# Patient Record
Sex: Female | Born: 1975 | Race: White | Hispanic: No | Marital: Married | Smoking: Former smoker
Health system: Southern US, Community
[De-identification: ages and names within clinical notes are randomized; demographics above are authoritative.]

## PROBLEM LIST (undated history)

## (undated) DIAGNOSIS — Z87891 Personal history of nicotine dependence: Secondary | ICD-10-CM

## (undated) DIAGNOSIS — F419 Anxiety disorder, unspecified: Secondary | ICD-10-CM

## (undated) DIAGNOSIS — L4050 Arthropathic psoriasis, unspecified: Secondary | ICD-10-CM

## (undated) DIAGNOSIS — T8859XA Other complications of anesthesia, initial encounter: Secondary | ICD-10-CM

## (undated) DIAGNOSIS — G43909 Migraine, unspecified, not intractable, without status migrainosus: Secondary | ICD-10-CM

## (undated) DIAGNOSIS — D259 Leiomyoma of uterus, unspecified: Secondary | ICD-10-CM

## (undated) DIAGNOSIS — E079 Disorder of thyroid, unspecified: Secondary | ICD-10-CM

## (undated) DIAGNOSIS — G4730 Sleep apnea, unspecified: Secondary | ICD-10-CM

## (undated) DIAGNOSIS — Z8719 Personal history of other diseases of the digestive system: Secondary | ICD-10-CM

## (undated) DIAGNOSIS — M5030 Other cervical disc degeneration, unspecified cervical region: Secondary | ICD-10-CM

## (undated) DIAGNOSIS — K8050 Calculus of bile duct without cholangitis or cholecystitis without obstruction: Secondary | ICD-10-CM

## (undated) DIAGNOSIS — E890 Postprocedural hypothyroidism: Secondary | ICD-10-CM

## (undated) DIAGNOSIS — E669 Obesity, unspecified: Secondary | ICD-10-CM

## (undated) DIAGNOSIS — N83209 Unspecified ovarian cyst, unspecified side: Secondary | ICD-10-CM

## (undated) DIAGNOSIS — J45909 Unspecified asthma, uncomplicated: Secondary | ICD-10-CM

## (undated) DIAGNOSIS — N921 Excessive and frequent menstruation with irregular cycle: Secondary | ICD-10-CM

## (undated) DIAGNOSIS — K635 Polyp of colon: Secondary | ICD-10-CM

## (undated) DIAGNOSIS — I9589 Other hypotension: Secondary | ICD-10-CM

## (undated) DIAGNOSIS — T7840XA Allergy, unspecified, initial encounter: Secondary | ICD-10-CM

## (undated) DIAGNOSIS — R112 Nausea with vomiting, unspecified: Secondary | ICD-10-CM

## (undated) HISTORY — DX: Unspecified ovarian cyst, unspecified side: N83.209

## (undated) HISTORY — DX: Anxiety disorder, unspecified: F41.9

## (undated) HISTORY — DX: Allergy, unspecified, initial encounter: T78.40XA

## (undated) HISTORY — PX: THYROIDECTOMY: SHX17

---
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
## 1995-05-11 HISTORY — PX: LAPAROSCOPY: SHX197

## 2010-05-10 HISTORY — PX: THYROIDECTOMY: SHX17

## 2016-05-10 HISTORY — PX: NASAL SINUS SURGERY: SHX719

## 2016-05-10 HISTORY — PX: RHINOPLASTY: SUR1284

## 2016-07-19 ENCOUNTER — Ambulatory Visit
Admission: EM | Admit: 2016-07-19 | Discharge: 2016-07-19 | Disposition: A | Discharge status: Home / Self Care | Payer: BLUE CROSS/BLUE SHIELD | Attending: Family Medicine | Admitting: Family Medicine

## 2016-07-19 ENCOUNTER — Encounter: Payer: Self-pay | Admitting: *Deleted

## 2016-07-19 DIAGNOSIS — J209 Acute bronchitis, unspecified: Secondary | ICD-10-CM | POA: Diagnosis not present

## 2016-07-19 DIAGNOSIS — J069 Acute upper respiratory infection, unspecified: Secondary | ICD-10-CM | POA: Diagnosis not present

## 2016-07-19 DIAGNOSIS — J0101 Acute recurrent maxillary sinusitis: Secondary | ICD-10-CM | POA: Diagnosis not present

## 2016-07-19 HISTORY — DX: Disorder of thyroid, unspecified: E07.9

## 2016-07-19 MED ORDER — HYDROCOD POLST-CPM POLST ER 10-8 MG/5ML PO SUER: 5.0000 mL | Freq: Two times a day (BID) | ORAL | 0 refills | Status: DC | PRN

## 2016-07-19 MED ORDER — IPRATROPIUM-ALBUTEROL 0.5-2.5 (3) MG/3ML IN SOLN
3.0000 mL | Freq: Once | RESPIRATORY_TRACT | Status: AC
  Administered 2016-07-19: 3 mL via RESPIRATORY_TRACT

## 2016-07-19 MED ORDER — PREDNISONE 10 MG (21) PO TBPK: ORAL_TABLET | ORAL | 0 refills | Status: DC

## 2016-07-19 MED ORDER — ALBUTEROL SULFATE HFA 108 (90 BASE) MCG/ACT IN AERS: 2.0000 | INHALATION_SPRAY | RESPIRATORY_TRACT | 1 refills | Status: DC | PRN

## 2016-07-19 MED ORDER — METHYLPREDNISOLONE SODIUM SUCC 125 MG IJ SOLR
125.0000 mg | Freq: Once | INTRAMUSCULAR | Status: AC
  Administered 2016-07-19: 125 mg via INTRAMUSCULAR

## 2016-07-19 MED ORDER — AZITHROMYCIN 500 MG PO TABS: ORAL_TABLET | ORAL | 0 refills | Status: DC

## 2016-07-19 NOTE — ED Provider Notes (Signed)
[facility]    CSN: [id number] Arrival date & time: 07/19/16  1624     History   Chief Complaint Chief Complaint  Patient presents with  . Cough  . Nasal Congestion  . Headache    HPI Alison Logan is a 41 y.o. female.   Patient is a 41 year old white female who's had a history of recurrent sinusitis and nasal congestion. She has isthroat doctor she sees in [place] and she states in the next few weeks she's also have her maxillary sinuses reamed out try to help clear up her sinuses and also take care of recurrent and persistent serous otitis media on the right. She reports she normally can't breathe through her nose and her ears always feel stuffy bolus 3-4 days she's had increased drainage down the back with throat which feels has gotten to the lungs. She states that now she is coughing was nonproductive she's wheezing she is unable to sleep at night and had to sit reclined up because the shortness of breath. No history of asthma but this been no fever either. The cough is not productive because she feels that she can capture. She's had a thyroidectomy but no known drug allergies habits she stopped smoking about 12 years ago. No pertinent family medical history relevant to today's visit   The history is provided by the patient. No language interpreter was used.  Cough  Cough characteristics:  Productive and non-productive Sputum characteristics:  Nondescript Severity:  Moderate Onset quality:  Sudden Timing:  Constant Progression:  Worsening Chronicity:  New Smoker: no   Context: upper respiratory infection and with activity   Relieved by:  Nothing Ineffective treatments:  None tried Associated symptoms: headaches, shortness of breath and sinus congestion   Associated symptoms: no chest pain, no fever, no rash, no rhinorrhea and no sore throat   Headache  Associated symptoms: cough   Associated symptoms: no fever and no sore throat     Past Medical  History:  Diagnosis Date  . Thyroid disease     There are no active problems to display for this patient.   Past Surgical History:  Procedure Laterality Date  . THYROIDECTOMY Bilateral     OB History    No data available       Home Medications    Prior to Admission medications   Medication Sig Start Date End Date Taking? Authorizing Provider  levothyroxine (SYNTHROID, LEVOTHROID) 112 MCG tablet Take 112 mcg by mouth daily before breakfast.   Yes Historical Provider, MD  albuterol (PROVENTIL HFA;VENTOLIN HFA) 108 (90 Base) MCG/ACT inhaler Inhale 2 puffs into the lungs every 4 (four) hours as needed for wheezing or shortness of breath. 07/19/16   Frederich Cha, MD  azithromycin (ZITHROMAX) 500 MG tablet 1 tablet daily for 5 days 07/19/16   Frederich Cha, MD  chlorpheniramine-HYDROcodone [facility]) 10-8 MG/5ML SUER Take 5 mLs by mouth every 12 (twelve) hours as needed for cough. 07/19/16   Frederich Cha, MD  predniSONE (STERAPRED UNI-PAK 21 TAB) 10 MG (21) TBPK tablet Sig 6 tablet day 1, 5 tablets day 2, 4 tablets day 3,,3tablets day 4, 2 tablets day 5, 1 tablet day 6 take all tablets orally 07/19/16   Frederich Cha, MD    Family History History reviewed. No pertinent family history.  Social History Social History  Substance Use Topics  . Smoking status: Never Smoker  . Smokeless tobacco: Never Used  . Alcohol use No     Allergies  Patient has no known allergies.   Review of Systems Review of Systems  Constitutional: Negative for fever.  HENT: Negative for rhinorrhea and sore throat.   Respiratory: Positive for cough and shortness of breath.   Cardiovascular: Negative for chest pain.  Skin: Negative for rash.  Neurological: Positive for headaches.  All other systems reviewed and are negative.    Physical Exam Triage Vital Signs ED Triage Vitals  Enc Vitals Group     BP 07/19/16 1706 133/69     Pulse Rate 07/19/16 1706 68     Resp 07/19/16 1706 16      Temp 07/19/16 1706 98.3 F (36.8 C)     Temp Source 07/19/16 1706 Oral     SpO2 07/19/16 1706 98 %     Weight 07/19/16 1708 205 lb (93 kg)     Height 07/19/16 1708 5\' 4"  (1.626 m)     Head Circumference --      Peak Flow --      Pain Score --      Pain Loc --      Pain Edu? --      Excl. in [place]? --    No data found.   Updated Vital Signs BP 133/69 (BP Location: Left Arm)   Pulse 68   Temp 98.3 F (36.8 C) (Oral)   Resp 16   Ht 5\' 4"  (1.626 m)   Wt 205 lb (93 kg)   LMP 06/25/2016 (Exact Date)   SpO2 98%   BMI 35.19 kg/m   Visual Acuity Right Eye Distance:   Left Eye Distance:   Bilateral Distance:    Right Eye Near:   Left Eye Near:    Bilateral Near:     Physical Exam  Constitutional: She appears well-developed and well-nourished.  HENT:  Head: Normocephalic and atraumatic.  Right Ear: Hearing, external ear and ear canal normal. Tympanic membrane is bulging.  Left Ear: Hearing, tympanic membrane and external ear normal.  Nose: Mucosal edema and rhinorrhea present. Right sinus exhibits maxillary sinus tenderness. Left sinus exhibits maxillary sinus tenderness.  Mouth/Throat: Uvula is midline and mucous membranes are normal. No oral lesions. No uvula swelling. Posterior oropharyngeal edema present.  Eyes: Pupils are equal, round, and reactive to light.  Neck: Normal range of motion. Neck supple.  Cardiovascular: Normal rate and regular rhythm.   Pulmonary/Chest: No respiratory distress. She has decreased breath sounds. She has wheezes. She exhibits no tenderness.  Abdominal: Soft.  Musculoskeletal: Normal range of motion.  Neurological: She is alert.  Skin: Skin is warm.  Psychiatric: She has a normal mood and affect.  Vitals reviewed.    UC Treatments / Results  Labs (all labs ordered are listed, but only abnormal results are displayed) Labs Reviewed - No data to display  EKG  EKG Interpretation None       Radiology No results  found.  Procedures Procedures (including critical care time)  Medications Ordered in UC Medications  ipratropium-albuterol (DUONEB) 0.5-2.5 (3) MG/3ML nebulizer solution 3 mL (3 mLs Nebulization Given 07/19/16 1828)  methylPREDNISolone sodium succinate (SOLU-MEDROL) 125 mg/2 mL injection 125 mg (125 mg Intramuscular Given 07/19/16 1825)     Initial Impression / Assessment and Plan / UC Course  I have reviewed the triage vital signs and the nursing notes.  Pertinent labs & imaging results that were available during my care of the patient were reviewed by me and considered in my medical decision making (see chart for details).  Patient is given aerosol treatment discussed with her we can treat her aggressively which she'll ask so will treat with aerosol treatment DuoNeb and 125 mg Solu-Medrol. We'll place on 60 course of prednisone Zithromax 501 tablet daily for 5 days and will also place her on 6 day course of prednisone and albuterol inhaler. Explained to her this may help her sinuses if it does great but will try to mainly treat the bronchitis and the bronchospasm that she's suffering from. Off work note but she declined.   Final Clinical Impressions(s) / UC Diagnoses   Final diagnoses:  Acute recurrent maxillary sinusitis  Acute bronchitis with bronchospasm  Acute upper respiratory infection    New Prescriptions New Prescriptions   ALBUTEROL (PROVENTIL HFA;VENTOLIN HFA) 108 (90 BASE) MCG/ACT INHALER    Inhale 2 puffs into the lungs every 4 (four) hours as needed for wheezing or shortness of breath.   AZITHROMYCIN (ZITHROMAX) 500 MG TABLET    1 tablet daily for 5 days   CHLORPHENIRAMINE-HYDROCODONE (TUSSIONEX PENNKINETIC ER) 10-8 MG/5ML SUER    Take 5 mLs by mouth every 12 (twelve) hours as needed for cough.   PREDNISONE (STERAPRED UNI-PAK 21 TAB) 10 MG (21) TBPK TABLET    Sig 6 tablet day 1, 5 tablets day 2, 4 tablets day 3,,3tablets day 4, 2 tablets day 5, 1 tablet day 6 take all  tablets orally      Note: This dictation was prepared with Dragon dictation along with smaller phrase technology. Any transcriptional errors that result from this process are unintentional.   Frederich Cha, MD 07/19/16 [phone]

## 2016-07-19 NOTE — ED Triage Notes (Signed)
Non-productive cough, head and chest congestion, headaches and runny nose. Pt scheduled for sinus surgery later this month. Hx of sinusitis.

## 2016-10-20 ENCOUNTER — Other Ambulatory Visit: Payer: Self-pay | Admitting: Family Medicine

## 2016-10-20 DIAGNOSIS — Z1239 Encounter for other screening for malignant neoplasm of breast: Secondary | ICD-10-CM

## 2016-12-21 DIAGNOSIS — J309 Allergic rhinitis, unspecified: Secondary | ICD-10-CM | POA: Insufficient documentation

## 2016-12-21 DIAGNOSIS — R519 Headache, unspecified: Secondary | ICD-10-CM | POA: Insufficient documentation

## 2016-12-21 DIAGNOSIS — J324 Chronic pansinusitis: Secondary | ICD-10-CM | POA: Insufficient documentation

## 2016-12-21 DIAGNOSIS — G4730 Sleep apnea, unspecified: Secondary | ICD-10-CM | POA: Insufficient documentation

## 2017-01-05 DIAGNOSIS — J4520 Mild intermittent asthma, uncomplicated: Secondary | ICD-10-CM | POA: Insufficient documentation

## 2017-01-05 DIAGNOSIS — Z91018 Allergy to other foods: Secondary | ICD-10-CM | POA: Insufficient documentation

## 2017-01-05 DIAGNOSIS — G43909 Migraine, unspecified, not intractable, without status migrainosus: Secondary | ICD-10-CM | POA: Insufficient documentation

## 2017-02-07 DIAGNOSIS — G4733 Obstructive sleep apnea (adult) (pediatric): Secondary | ICD-10-CM | POA: Insufficient documentation

## 2017-03-03 DIAGNOSIS — E890 Postprocedural hypothyroidism: Secondary | ICD-10-CM | POA: Insufficient documentation

## 2018-01-25 ENCOUNTER — Emergency Department
Admission: EM | Admit: 2018-01-25 | Discharge: 2018-01-25 | Disposition: A | Discharge status: Home / Self Care | Payer: 59 | Attending: Emergency Medicine | Admitting: Emergency Medicine

## 2018-01-25 ENCOUNTER — Encounter: Payer: Self-pay | Admitting: Emergency Medicine

## 2018-01-25 ENCOUNTER — Emergency Department: Payer: 59

## 2018-01-25 ENCOUNTER — Other Ambulatory Visit: Payer: Self-pay

## 2018-01-25 DIAGNOSIS — E039 Hypothyroidism, unspecified: Secondary | ICD-10-CM | POA: Diagnosis not present

## 2018-01-25 DIAGNOSIS — Z79899 Other long term (current) drug therapy: Secondary | ICD-10-CM | POA: Diagnosis not present

## 2018-01-25 DIAGNOSIS — R1032 Left lower quadrant pain: Secondary | ICD-10-CM | POA: Insufficient documentation

## 2018-01-25 DIAGNOSIS — K5900 Constipation, unspecified: Secondary | ICD-10-CM

## 2018-01-25 DIAGNOSIS — R109 Unspecified abdominal pain: Secondary | ICD-10-CM

## 2018-01-25 LAB — COMPREHENSIVE METABOLIC PANEL
ALBUMIN: 4.1 g/dL (ref 3.5–5.0)
ALT: 18 U/L (ref 0–44)
ANION GAP: 8 (ref 5–15)
AST: 22 U/L (ref 15–41)
Alkaline Phosphatase: 45 U/L (ref 38–126)
BILIRUBIN TOTAL: 0.6 mg/dL (ref 0.3–1.2)
BUN: 25 mg/dL — AB (ref 6–20)
CO2: 23 mmol/L (ref 22–32)
Calcium: 8.5 mg/dL — ABNORMAL LOW (ref 8.9–10.3)
Chloride: 108 mmol/L (ref 98–111)
Creatinine, Ser: 1.3 mg/dL — ABNORMAL HIGH (ref 0.44–1.00)
GFR, EST AFRICAN AMERICAN: 58 mL/min — AB (ref >60)
GFR, EST NON AFRICAN AMERICAN: 50 mL/min — AB (ref >60)
Glucose, Bld: 111 mg/dL — ABNORMAL HIGH (ref 70–99)
POTASSIUM: 3.9 mmol/L (ref 3.5–5.1)
SODIUM: 139 mmol/L (ref 135–145)
TOTAL PROTEIN: 7.4 g/dL (ref 6.5–8.1)

## 2018-01-25 LAB — URINALYSIS, COMPLETE (UACMP) WITH MICROSCOPIC
BACTERIA UA: NONE SEEN
BILIRUBIN URINE: NEGATIVE
Glucose, UA: NEGATIVE mg/dL
KETONES UR: NEGATIVE mg/dL
LEUKOCYTES UA: NEGATIVE
NITRITE: NEGATIVE
Protein, ur: NEGATIVE mg/dL
SPECIFIC GRAVITY, URINE: 1.016 (ref 1.005–1.030)
pH: 5 (ref 5.0–8.0)

## 2018-01-25 LAB — CBC
HEMATOCRIT: 40.2 % (ref 35.0–47.0)
HEMOGLOBIN: 14.3 g/dL (ref 12.0–16.0)
MCH: 33.2 pg (ref 26.0–34.0)
MCHC: 35.7 g/dL (ref 32.0–36.0)
MCV: 93.2 fL (ref 80.0–100.0)
Platelets: 291 10*3/uL (ref 150–440)
RBC: 4.31 MIL/uL (ref 3.80–5.20)
RDW: 13.2 % (ref 11.5–14.5)
WBC: 8.4 10*3/uL (ref 3.6–11.0)

## 2018-01-25 LAB — POCT PREGNANCY, URINE: PREG TEST UR: NEGATIVE

## 2018-01-25 LAB — LIPASE, BLOOD: LIPASE: 53 U/L — AB (ref 11–51)

## 2018-01-25 MED ORDER — HYDROMORPHONE HCL 1 MG/ML IJ SOLN
0.5000 mg | Freq: Once | INTRAMUSCULAR | Status: AC
  Administered 2018-01-25: 0.5 mg via INTRAVENOUS
  Filled 2018-01-25: qty 1

## 2018-01-25 MED ORDER — LACTULOSE 10 GM/15ML PO SOLN: 20.0000 g | Freq: Every day | ORAL | 0 refills | Status: DC | PRN

## 2018-01-25 MED ORDER — OXYCODONE-ACETAMINOPHEN 5-325 MG PO TABS: 1.0000 | ORAL_TABLET | ORAL | 0 refills | Status: DC | PRN

## 2018-01-25 MED ORDER — IBUPROFEN 800 MG PO TABS: 800.0000 mg | ORAL_TABLET | Freq: Three times a day (TID) | ORAL | 0 refills | Status: DC | PRN

## 2018-01-25 MED ORDER — ONDANSETRON 4 MG PO TBDP: 4.0000 mg | ORAL_TABLET | Freq: Three times a day (TID) | ORAL | 0 refills | Status: DC | PRN

## 2018-01-25 MED ORDER — ONDANSETRON HCL 4 MG/2ML IJ SOLN
4.0000 mg | Freq: Once | INTRAMUSCULAR | Status: AC
  Administered 2018-01-25: 4 mg via INTRAVENOUS
  Filled 2018-01-25: qty 2

## 2018-01-25 MED ORDER — SODIUM CHLORIDE 0.9 % IV BOLUS
1000.0000 mL | Freq: Once | INTRAVENOUS | Status: AC
  Administered 2018-01-25: 1000 mL via INTRAVENOUS

## 2018-01-25 NOTE — ED Provider Notes (Signed)
[facility] Emergency Department Provider Note   ____________________________________________   First MD Initiated Contact with Patient 01/25/18 [phone]     (approximate)  I have reviewed the triage vital signs and the nursing notes.   HISTORY  Chief Complaint Abdominal Pain    HPI Alison Logan is a 42 y.o. female who presents to the ED from home with a chief complaint of left flank pain.  Patient was awakened at 2 AM with left flank pain, sharp, radiating to her left lower abdomen.  Symptoms associated with nausea only.  Patient started her menstrual cycle the day before so initially thought it was menstrual cramps.  Took ibuprofen prior to arrival without relief of symptoms.  Denies associated fever, chills, chest pain, shortness of breath, vomiting, hematuria.  Spouse states patient had a "sour smelling diarrhea".  Denies recent travel or trauma.   Past Medical History:  Diagnosis Date  . Thyroid disease     There are no active problems to display for this patient.   Past Surgical History:  Procedure Laterality Date  . THYROIDECTOMY Bilateral     Prior to Admission medications   Medication Sig Start Date End Date Taking? Authorizing Provider  albuterol (PROVENTIL HFA;VENTOLIN HFA) 108 (90 Base) MCG/ACT inhaler Inhale 2 puffs into the lungs every 4 (four) hours as needed for wheezing or shortness of breath. 07/19/16   Frederich Cha, MD  azithromycin (ZITHROMAX) 500 MG tablet 1 tablet daily for 5 days 07/19/16   Frederich Cha, MD  chlorpheniramine-HYDROcodone [facility]) 10-8 MG/5ML SUER Take 5 mLs by mouth every 12 (twelve) hours as needed for cough. 07/19/16   Frederich Cha, MD  ibuprofen (ADVIL,MOTRIN) 800 MG tablet Take 1 tablet (800 mg total) by mouth every 8 (eight) hours as needed for moderate pain. 01/25/18   Paulette Blanch, MD  lactulose (CHRONULAC) 10 GM/15ML solution Take 30 mLs (20 g total) by mouth daily as needed for mild  constipation. 01/25/18   Paulette Blanch, MD  levothyroxine (SYNTHROID, LEVOTHROID) 112 MCG tablet Take 112 mcg by mouth daily before breakfast.    [provider]  ondansetron (ZOFRAN ODT) 4 MG disintegrating tablet Take 1 tablet (4 mg total) by mouth every 8 (eight) hours as needed for nausea or vomiting. 01/25/18   Paulette Blanch, MD  oxyCODONE-acetaminophen (PERCOCET/ROXICET) 5-325 MG tablet Take 1 tablet by mouth every 4 (four) hours as needed for severe pain. 01/25/18   Paulette Blanch, MD  predniSONE (STERAPRED UNI-PAK 21 TAB) 10 MG (21) TBPK tablet Sig 6 tablet day 1, 5 tablets day 2, 4 tablets day 3,,3tablets day 4, 2 tablets day 5, 1 tablet day 6 take all tablets orally 07/19/16   Frederich Cha, MD    Allergies Patient has no known allergies.  No family history on file.  Social History Social History   Tobacco Use  . Smoking status: Never Smoker  . Smokeless tobacco: Never Used  Substance Use Topics  . Alcohol use: No  . Drug use: Not on file    Review of Systems  Constitutional: No fever/chills Eyes: No visual changes. ENT: No sore throat. Cardiovascular: Denies chest pain. Respiratory: Denies shortness of breath. Gastrointestinal: Positive for left flank to left lower quadrant abdominal pain.  Positive for nausea, no vomiting.  No diarrhea.  No constipation. Genitourinary: Negative for dysuria. Musculoskeletal: Negative for back pain. Skin: Negative for rash. Neurological: Negative for headaches, focal weakness or numbness.   ____________________________________________   PHYSICAL  EXAM:  VITAL SIGNS: ED Triage Vitals  Enc Vitals Group     BP 01/25/18 0340 97/79     Pulse Rate 01/25/18 0340 77     Resp 01/25/18 0340 18     Temp 01/25/18 0340 (!) 97.5 F (36.4 C)     Temp Source 01/25/18 0340 Oral     SpO2 01/25/18 0340 97 %     Weight 01/25/18 0339 205 lb (93 kg)     Height 01/25/18 0339 5\' 4"  (1.626 m)     Head Circumference --      Peak Flow --       Pain Score 01/25/18 0339 10     Pain Loc --      Pain Edu? --      Excl. in [place]? --     Constitutional: Alert and oriented. Well appearing and in moderate acute distress. Eyes: Conjunctivae are normal. PERRL. EOMI. Head: Atraumatic. Nose: No congestion/rhinnorhea. Mouth/Throat: Mucous membranes are moist.  Oropharynx non-erythematous. Neck: No stridor.   Cardiovascular: Normal rate, regular rhythm. Grossly normal heart sounds.  Good peripheral circulation. Respiratory: Normal respiratory effort.  No retractions. Lungs CTAB. Gastrointestinal: Soft and mildly tender to palpation left lower quadrant without rebound or guarding. No distention. No abdominal bruits.  Mild left CVA tenderness. Musculoskeletal: No lower extremity tenderness nor edema.  No joint effusions. Neurologic:  Normal speech and language. No gross focal neurologic deficits are appreciated. No gait instability. Skin:  Skin is warm, dry and intact. No rash noted. Psychiatric: Mood and affect are normal. Speech and behavior are normal.  ____________________________________________   LABS (all labs ordered are listed, but only abnormal results are displayed)  Labs Reviewed  LIPASE, BLOOD - Abnormal; Notable for the following components:      Result Value   Lipase 53 (*)    All other components within normal limits  COMPREHENSIVE METABOLIC PANEL - Abnormal; Notable for the following components:   Glucose, Bld 111 (*)    BUN 25 (*)    Creatinine, Ser 1.30 (*)    Calcium 8.5 (*)    GFR calc non Af Amer 50 (*)    GFR calc Af Amer 58 (*)    All other components within normal limits  URINALYSIS, COMPLETE (UACMP) WITH MICROSCOPIC - Abnormal; Notable for the following components:   Color, Urine YELLOW (*)    APPearance CLEAR (*)    Hgb urine dipstick LARGE (*)    RBC / HPF >50 (*)    All other components within normal limits  CBC  POC URINE PREG, ED  POCT PREGNANCY, URINE    ____________________________________________  EKG  None ____________________________________________  RADIOLOGY  ED MD interpretation: I personally reviewed patient's CT scan including scout film: No acute abnormality; moderate stool burden  Official radiology report(s): Ct Renal Stone Study  Result Date: 01/25/2018 CLINICAL DATA:  Initial evaluation for acute left lower abdominal pain radiating to back. EXAM: CT ABDOMEN AND PELVIS WITHOUT CONTRAST TECHNIQUE: Multidetector CT imaging of the abdomen and pelvis was performed following the standard protocol without IV contrast. COMPARISON:  None. FINDINGS: Lower chest: Mild subsegmental atelectatic changes seen dependently within the lung bases. Visualized lungs are otherwise clear. Hepatobiliary: Limited noncontrast evaluation of the liver is unremarkable. Gallbladder within normal limits. No biliary dilatation. Pancreas: Pancreas within normal limits. Spleen: Spleen within normal limits. Adrenals/Urinary Tract: Adrenal glands are normal. Kidneys equal in size without evidence for nephrolithiasis or hydronephrosis. No radiopaque calculi seen along the course of either  renal collecting system. No hydroureter. Partially distended bladder within normal limits. No layering stones within the bladder lumen. Stomach/Bowel: Small hiatal hernia. Stomach otherwise unremarkable. No evidence for bowel obstruction. Normal appendix. No acute inflammatory changes seen about the bowels. Vascular/Lymphatic: Intra-abdominal aorta of normal caliber. No adenopathy. Reproductive: Uterus and ovaries within normal limits. 16 mm cyst at the right aspect of the cervix likely reflects a nabothian cyst. Other: No free air or fluid. Small fat containing paraumbilical hernia noted without associated inflammation. Musculoskeletal: No acute osseous abnormality. No worrisome lytic or blastic osseous lesions. Bilateral facet arthropathy noted within the lower lumbar spine.  IMPRESSION: 1. No CT evidence for nephrolithiasis or obstructive uropathy. 2. No other acute intra-abdominal or pelvic process identified. Electronically Signed   By: Jeannine Boga M.D.   On: 01/25/2018 05:39    ____________________________________________   PROCEDURES  Procedure(s) performed: None  Procedures  Critical Care performed: No  ____________________________________________   INITIAL IMPRESSION / ASSESSMENT AND PLAN / ED COURSE  As part of my medical decision making, I reviewed the following data within the [place] History obtained from family, Nursing notes reviewed and incorporated, Labs reviewed, Radiograph reviewed and Notes from prior ED visits   42 year old female who presents with left flank to left lower quadrant abdominal pain. Differential diagnosis includes, but is not limited to, ovarian cyst, ovarian torsion, acute appendicitis, diverticulitis, urinary tract infection/pyelonephritis, endometriosis, bowel obstruction, colitis, renal colic, gastroenteritis, hernia, fibroids, endometriosis, pregnancy related pain including ectopic pregnancy, etc.  No prior history of kidney stones.  Laboratory and urinalysis results remarkable for minimally elevated lipase and creatinine; RBC greater than 50.  Will initiate IV fluid resuscitation, 0.5 mg IV Dilaudid for pain paired with 4 mg IV Zofran for nausea.  Will obtain CT renal colic study to evaluate for etiology of patient's symptoms.   Clinical Course as of Jan 25 558  Wed Jan 25, 2018  0554 Pain significantly improved.  Patient resting in no acute distress.  Updated patient and spouse on CT imaging result.  We discussed the possibility that patient may have a non-calcium stone or a stone less than 30mm in size which did not show up on CT scan.  Her symptoms certainly suggestive of ureteral colic.  Will discharge home on Lactulose, Percocet and Zofran to use as needed.  Strict return precautions  given.  Both verbalize understanding and agree with plan of care.   [JS]    Clinical Course User Index [JS] Paulette Blanch, MD     ____________________________________________   FINAL CLINICAL IMPRESSION(S) / ED DIAGNOSES  Final diagnoses:  Constipation, unspecified constipation type  Flank pain     ED Discharge Orders         Ordered    ibuprofen (ADVIL,MOTRIN) 800 MG tablet  Every 8 hours PRN     01/25/18 0557    oxyCODONE-acetaminophen (PERCOCET/ROXICET) 5-325 MG tablet  Every 4 hours PRN     01/25/18 0557    ondansetron (ZOFRAN ODT) 4 MG disintegrating tablet  Every 8 hours PRN     01/25/18 0557    lactulose (CHRONULAC) 10 GM/15ML solution  Daily PRN     01/25/18 0559           Note:  This document was prepared using Dragon voice recognition software and may include unintentional dictation errors.    Paulette Blanch, MD 01/25/18 [phone]

## 2018-01-25 NOTE — Discharge Instructions (Signed)
1.  You may take pain and nausea medicines as needed (Motrin/Percocet/Zofran). 2.  Take lactulose as needed for bowel movements. 3.  Drink plenty of bottled or filtered water daily. 4.  Return to the ER for worsening symptoms, persistent vomiting, difficulty breathing or other concerns.

## 2018-01-25 NOTE — ED Triage Notes (Signed)
Patient ambulatory to triage with steady gait, without difficulty or distress noted; pt reports awakening at 2am with left lower abd pain radiating into back accomp by nausea

## 2018-03-24 ENCOUNTER — Other Ambulatory Visit: Payer: Self-pay

## 2018-03-24 ENCOUNTER — Ambulatory Visit
Admission: EM | Admit: 2018-03-24 | Discharge: 2018-03-24 | Disposition: A | Discharge status: Home / Self Care | Payer: 59 | Attending: Family Medicine | Admitting: Family Medicine

## 2018-03-24 ENCOUNTER — Encounter: Payer: Self-pay | Admitting: Emergency Medicine

## 2018-03-24 DIAGNOSIS — J45909 Unspecified asthma, uncomplicated: Secondary | ICD-10-CM | POA: Diagnosis not present

## 2018-03-24 DIAGNOSIS — R0789 Other chest pain: Secondary | ICD-10-CM | POA: Diagnosis not present

## 2018-03-24 MED ORDER — BECLOMETHASONE DIPROP HFA 40 MCG/ACT IN AERB: 1.0000 | INHALATION_SPRAY | Freq: Two times a day (BID) | RESPIRATORY_TRACT | 0 refills | Status: DC

## 2018-03-24 MED ORDER — ALBUTEROL SULFATE HFA 108 (90 BASE) MCG/ACT IN AERS: 1.0000 | INHALATION_SPRAY | Freq: Four times a day (QID) | RESPIRATORY_TRACT | 0 refills | Status: DC | PRN

## 2018-03-24 NOTE — Discharge Instructions (Signed)
Medication as prescribed.  If persists, see pulmonology.   Take care  Dr. Lacinda Axon

## 2018-03-24 NOTE — ED Provider Notes (Signed)
[facility]    CSN: [id number] Arrival date & time: 03/24/18  1517  History   Chief Complaint Chief Complaint  Patient presents with  . Cough   HPI  42 year old female presents with chest tightness and shortness of breath.  2 day history of chest tightness, shortness of breath, congestion.  Shortness of breath is worse with exertion.  Electronic medical record reveals that patient has a history of asthma.  No fever.  No chills.  No known inciting factor.  Patient also reports ear "swooshing".  No reports of purulent nasal discharge.  No other associated respiratory symptoms.  No medications or interventions tried.  No other complaints  PMH, Surgical Hx, Family Hx, Social History reviewed and updated as below.  Past Medical History:  Diagnosis Date  . Thyroid disease   Migraine Chronic sinusitis OSA Asthma   Past Surgical History:  Procedure Laterality Date  . THYROIDECTOMY Bilateral   Sinus surgery   OB History   None    Home Medications    Prior to Admission medications   Medication Sig Start Date End Date Taking? Authorizing Provider  levothyroxine (SYNTHROID, LEVOTHROID) 112 MCG tablet Take 112 mcg by mouth daily before breakfast.   Yes [provider]  liothyronine (CYTOMEL) 5 MCG tablet Take by mouth. 02/08/18 02/08/19 Yes [provider]  albuterol (PROVENTIL HFA;VENTOLIN HFA) 108 (90 Base) MCG/ACT inhaler Inhale 1-2 puffs into the lungs every 6 (six) hours as needed for wheezing or shortness of breath. 03/24/18   Coral Spikes, DO  beclomethasone (QVAR) 40 MCG/ACT inhaler Inhale 1 puff into the lungs 2 (two) times daily. 03/24/18   Coral Spikes, DO   Family History Family History  Problem Relation Age of Onset  . Healthy Mother   . Healthy Father    Social History Social History   Tobacco Use  . Smoking status: Never Smoker  . Smokeless tobacco: Never Used  Substance Use Topics  . Alcohol use: No  . Drug use: Not on  file   Allergies   Patient has no known allergies.   Review of Systems Review of Systems  Constitutional: Positive for fatigue. Negative for fever.  Respiratory: Positive for chest tightness and shortness of breath.    Physical Exam Triage Vital Signs ED Triage Vitals  Enc Vitals Group     BP 03/24/18 1532 109/74     Pulse Rate 03/24/18 1532 64     Resp 03/24/18 1532 16     Temp 03/24/18 1532 97.7 F (36.5 C)     Temp Source 03/24/18 1532 Oral     SpO2 03/24/18 1532 99 %     Weight 03/24/18 1528 205 lb (93 kg)     Height 03/24/18 1528 5\' 4"  (1.626 m)     Head Circumference --      Peak Flow --      Pain Score 03/24/18 1528 0     Pain Loc --      Pain Edu? --      Excl. in [place]? --    Updated Vital Signs BP 109/74 (BP Location: Left Arm)   Pulse 64   Temp 97.7 F (36.5 C) (Oral)   Resp 16   Ht 5\' 4"  (1.626 m)   Wt 93 kg   LMP 03/21/2018 (Exact Date)   SpO2 99%   BMI 35.19 kg/m   Visual Acuity Right Eye Distance:   Left Eye Distance:   Bilateral Distance:    Right Eye  Near:   Left Eye Near:    Bilateral Near:     Physical Exam  Constitutional: She is oriented to person, place, and time. She appears well-developed. No distress.  HENT:  Head: Normocephalic and atraumatic.  Cardiovascular: Normal rate and regular rhythm.  Pulmonary/Chest: Effort normal and breath sounds normal. She has no wheezes. She has no rales.  Neurological: She is alert and oriented to person, place, and time.  Psychiatric: She has a normal mood and affect. Her behavior is normal.  Nursing note and vitals reviewed.  UC Treatments / Results  Labs (all labs ordered are listed, but only abnormal results are displayed) Labs Reviewed - No data to display  EKG None  Radiology No results found.  Procedures Procedures (including critical care time)  Medications Ordered in UC Medications - No data to display  Initial Impression / Assessment and Plan / UC Course  I have reviewed  the triage vital signs and the nursing notes.  Pertinent labs & imaging results that were available during my care of the patient were reviewed by me and considered in my medical decision making (see chart for details).    42 year old female presents with chest tightness and shortness of breath.  No evidence of PE.  No clinical evidence of respiratory infection.  Suspect that this is related to asthma.  Placing on albuterol and Qvar.  Advised to see pulmonology  Final Clinical Impressions(s) / UC Diagnoses   Final diagnoses:  Chest tightness     Discharge Instructions     Medication as prescribed.  If persists, see pulmonology.   Take care  Dr. Lacinda Axon    ED Prescriptions    Medication Sig Dispense Auth. Provider   albuterol (PROVENTIL HFA;VENTOLIN HFA) 108 (90 Base) MCG/ACT inhaler Inhale 1-2 puffs into the lungs every 6 (six) hours as needed for wheezing or shortness of breath. 1 Inhaler Leopoldo Mazzie G, DO   beclomethasone (QVAR) 40 MCG/ACT inhaler Inhale 1 puff into the lungs 2 (two) times daily. 10.6 g Coral Spikes, DO     Controlled Substance Prescriptions [place] Controlled Substance Registry consulted? Not Applicable   Coral Spikes, DO 03/24/18 1654

## 2018-03-24 NOTE — ED Triage Notes (Signed)
Patient c/o chest congestion and SOB that started 2 days ago.

## 2018-03-30 ENCOUNTER — Other Ambulatory Visit: Payer: Self-pay

## 2018-03-30 ENCOUNTER — Encounter: Payer: Self-pay | Admitting: Emergency Medicine

## 2018-03-30 ENCOUNTER — Emergency Department
Admission: EM | Admit: 2018-03-30 | Discharge: 2018-03-30 | Disposition: A | Discharge status: Home / Self Care | Payer: 59 | Attending: Emergency Medicine | Admitting: Emergency Medicine

## 2018-03-30 DIAGNOSIS — R1030 Lower abdominal pain, unspecified: Secondary | ICD-10-CM | POA: Insufficient documentation

## 2018-03-30 DIAGNOSIS — Z5321 Procedure and treatment not carried out due to patient leaving prior to being seen by health care provider: Secondary | ICD-10-CM | POA: Diagnosis not present

## 2018-03-30 LAB — URINALYSIS, COMPLETE (UACMP) WITH MICROSCOPIC
BACTERIA UA: NONE SEEN
Bilirubin Urine: NEGATIVE
Glucose, UA: NEGATIVE mg/dL
Hgb urine dipstick: NEGATIVE
KETONES UR: NEGATIVE mg/dL
LEUKOCYTES UA: NEGATIVE
Nitrite: NEGATIVE
PH: 6 (ref 5.0–8.0)
Protein, ur: NEGATIVE mg/dL
Specific Gravity, Urine: 1.005 (ref 1.005–1.030)

## 2018-03-30 LAB — COMPREHENSIVE METABOLIC PANEL
ALT: 20 U/L (ref 0–44)
ANION GAP: 8 (ref 5–15)
AST: 19 U/L (ref 15–41)
Albumin: 4.1 g/dL (ref 3.5–5.0)
Alkaline Phosphatase: 45 U/L (ref 38–126)
BUN: 20 mg/dL (ref 6–20)
CHLORIDE: 102 mmol/L (ref 98–111)
CO2: 28 mmol/L (ref 22–32)
Calcium: 8.7 mg/dL — ABNORMAL LOW (ref 8.9–10.3)
Creatinine, Ser: 1.1 mg/dL — ABNORMAL HIGH (ref 0.44–1.00)
GFR calc non Af Amer: >60 mL/min (ref >60)
Glucose, Bld: 109 mg/dL — ABNORMAL HIGH (ref 70–99)
Potassium: 3.9 mmol/L (ref 3.5–5.1)
SODIUM: 138 mmol/L (ref 135–145)
Total Bilirubin: 0.7 mg/dL (ref 0.3–1.2)
Total Protein: 7.5 g/dL (ref 6.5–8.1)

## 2018-03-30 LAB — CBC
HEMATOCRIT: 41 % (ref 36.0–46.0)
HEMOGLOBIN: 13.7 g/dL (ref 12.0–15.0)
MCH: 32 pg (ref 26.0–34.0)
MCHC: 33.4 g/dL (ref 30.0–36.0)
MCV: 95.8 fL (ref 80.0–100.0)
NRBC: 0 % (ref 0.0–0.2)
Platelets: 342 10*3/uL (ref 150–400)
RBC: 4.28 MIL/uL (ref 3.87–5.11)
RDW: 12.7 % (ref 11.5–15.5)
WBC: 9.1 10*3/uL (ref 4.0–10.5)

## 2018-03-30 LAB — LIPASE, BLOOD: LIPASE: 44 U/L (ref 11–51)

## 2018-03-30 LAB — POCT PREGNANCY, URINE: PREG TEST UR: NEGATIVE

## 2018-03-30 NOTE — ED Triage Notes (Signed)
Patient reports lower abdominal pain and nausea. Reports episode of vomiting and diarrhea yesterday. Patient reports diagnosed with kidney stone 2 weeks ago.

## 2018-03-30 NOTE — ED Notes (Signed)
Pt amb to STAT desk without diff or distress noted, accomp by SO; reports that she is tired of waiting and is leaving now; encouraged to stay and be evaluated as she will be going to next available exam room but pt st she is just tired of waiting and is leaving now; instr to return for any new or worsening symptoms

## 2018-04-04 ENCOUNTER — Telehealth: Payer: Self-pay | Admitting: Emergency Medicine

## 2018-04-04 NOTE — Telephone Encounter (Signed)
Called patient due to lwot to inquire about condition and follow up plans. Left message.   

## 2018-05-15 DIAGNOSIS — E890 Postprocedural hypothyroidism: Secondary | ICD-10-CM | POA: Diagnosis not present

## 2018-05-22 DIAGNOSIS — E039 Hypothyroidism, unspecified: Secondary | ICD-10-CM | POA: Diagnosis not present

## 2018-07-27 ENCOUNTER — Other Ambulatory Visit: Payer: Self-pay | Admitting: Family Medicine

## 2018-08-14 DIAGNOSIS — K641 Second degree hemorrhoids: Secondary | ICD-10-CM | POA: Diagnosis not present

## 2018-10-24 DIAGNOSIS — G43009 Migraine without aura, not intractable, without status migrainosus: Secondary | ICD-10-CM | POA: Diagnosis not present

## 2018-12-01 DIAGNOSIS — N6320 Unspecified lump in the left breast, unspecified quadrant: Secondary | ICD-10-CM | POA: Diagnosis not present

## 2018-12-05 DIAGNOSIS — G5601 Carpal tunnel syndrome, right upper limb: Secondary | ICD-10-CM | POA: Diagnosis not present

## 2018-12-05 DIAGNOSIS — M5412 Radiculopathy, cervical region: Secondary | ICD-10-CM | POA: Diagnosis not present

## 2019-01-09 DIAGNOSIS — G43009 Migraine without aura, not intractable, without status migrainosus: Secondary | ICD-10-CM | POA: Diagnosis not present

## 2019-01-20 ENCOUNTER — Other Ambulatory Visit: Payer: Self-pay | Admitting: Family Medicine

## 2019-01-22 DIAGNOSIS — E039 Hypothyroidism, unspecified: Secondary | ICD-10-CM | POA: Diagnosis not present

## 2019-01-29 DIAGNOSIS — E039 Hypothyroidism, unspecified: Secondary | ICD-10-CM | POA: Diagnosis not present

## 2019-03-14 DIAGNOSIS — J45901 Unspecified asthma with (acute) exacerbation: Secondary | ICD-10-CM | POA: Diagnosis not present

## 2019-03-14 DIAGNOSIS — E039 Hypothyroidism, unspecified: Secondary | ICD-10-CM | POA: Diagnosis not present

## 2019-04-10 DIAGNOSIS — N920 Excessive and frequent menstruation with regular cycle: Secondary | ICD-10-CM | POA: Diagnosis not present

## 2019-04-10 DIAGNOSIS — N946 Dysmenorrhea, unspecified: Secondary | ICD-10-CM | POA: Diagnosis not present

## 2019-04-10 DIAGNOSIS — Z01419 Encounter for gynecological examination (general) (routine) without abnormal findings: Secondary | ICD-10-CM | POA: Diagnosis not present

## 2019-04-10 DIAGNOSIS — Z1231 Encounter for screening mammogram for malignant neoplasm of breast: Secondary | ICD-10-CM | POA: Diagnosis not present

## 2019-06-06 ENCOUNTER — Encounter: Payer: Self-pay | Admitting: Emergency Medicine

## 2019-06-06 ENCOUNTER — Ambulatory Visit
Admission: EM | Admit: 2019-06-06 | Discharge: 2019-06-06 | Disposition: A | Discharge status: Home / Self Care | Payer: No Typology Code available for payment source | Attending: Family Medicine | Admitting: Family Medicine

## 2019-06-06 ENCOUNTER — Other Ambulatory Visit: Payer: Self-pay

## 2019-06-06 DIAGNOSIS — X500XXA Overexertion from strenuous movement or load, initial encounter: Secondary | ICD-10-CM

## 2019-06-06 DIAGNOSIS — S39012A Strain of muscle, fascia and tendon of lower back, initial encounter: Secondary | ICD-10-CM

## 2019-06-06 DIAGNOSIS — M545 Low back pain: Secondary | ICD-10-CM | POA: Diagnosis not present

## 2019-06-06 HISTORY — DX: Migraine, unspecified, not intractable, without status migrainosus: G43.909

## 2019-06-06 MED ORDER — CYCLOBENZAPRINE HCL 10 MG PO TABS: 10.0000 mg | ORAL_TABLET | Freq: Two times a day (BID) | ORAL | 0 refills | Status: DC | PRN

## 2019-06-06 MED ORDER — MELOXICAM 15 MG PO TABS: 15.0000 mg | ORAL_TABLET | Freq: Every day | ORAL | 0 refills | Status: DC | PRN

## 2019-06-06 NOTE — ED Provider Notes (Signed)
[facility] ____________________________________________  Time seen: Approximately 3:21 PM  I have reviewed the triage vital signs and the nursing notes.   HISTORY  Chief Complaint Back Pain (APPT)   HPI Alison Logan is a 44 y.o. female presenting for evaluation of back pain.  Patient reports yesterday she was reworking in reorganizing a file cabinet with a lot of bending.  Denies any direct injury or direct trauma.  Denies abrupt onset.  States she felt a mild strain in her back while working on mobile cabinet and then pain increased overnight as she sat still.  States pain today is present diffusely to her back, mostly in the middle.  Pain with active movement.  Denies pain radiation, abdominal pain, flank pain, dysuria, decreased range of motion.  Denies alleviating measures.  Reports otherwise doing well.  Denies recent sickness.    Past Medical History:  Diagnosis Date  . Migraines   . Thyroid disease     There are no problems to display for this patient.   Past Surgical History:  Procedure Laterality Date  . THYROIDECTOMY Bilateral      No current facility-administered medications for this encounter.  Current Outpatient Medications:  .  albuterol (PROVENTIL HFA;VENTOLIN HFA) 108 (90 Base) MCG/ACT inhaler, Inhale 1-2 puffs into the lungs every 6 (six) hours as needed for wheezing or shortness of breath., Disp: 1 Inhaler, Rfl: 0 .  beclomethasone (QVAR) 40 MCG/ACT inhaler, Inhale 1 puff into the lungs 2 (two) times daily., Disp: 10.6 g, Rfl: 0 .  levothyroxine (SYNTHROID, LEVOTHROID) 112 MCG tablet, Take 112 mcg by mouth daily before breakfast., Disp: , Rfl:  .  liothyronine (CYTOMEL) 5 MCG tablet, Take by mouth., Disp: , Rfl:  .  UBRELVY 100 MG TABS, , Disp: , Rfl:  .  cyclobenzaprine (FLEXERIL) 10 MG tablet, Take 1 tablet (10 mg total) by mouth 2 (two) times daily as needed for muscle spasms. Do not drive while taking as can cause drowsiness, Disp:  15 tablet, Rfl: 0 .  meloxicam (MOBIC) 15 MG tablet, Take 1 tablet (15 mg total) by mouth daily as needed., Disp: 7 tablet, Rfl: 0  Allergies Patient has no known allergies.  Family History  Problem Relation Age of Onset  . Healthy Mother   . Healthy Father     Social History Social History   Tobacco Use  . Smoking status: Never Smoker  . Smokeless tobacco: Never Used  Substance Use Topics  . Alcohol use: No  . Drug use: Never    Review of Systems Constitutional: No fever ENT: No sore throat. Cardiovascular: Denies chest pain. Respiratory: Denies shortness of breath. Gastrointestinal: No abdominal pain.  No nausea, no vomiting.  No diarrhea.  Genitourinary: Negative for dysuria. Musculoskeletal: Positive for back pain. Skin: Negative for rash.   ____________________________________________   PHYSICAL EXAM:  VITAL SIGNS: ED Triage Vitals  Enc Vitals Group     BP 06/06/19 1456 113/73     Pulse --      Resp 06/06/19 1456 18     Temp 06/06/19 1456 98.4 F (36.9 C)     Temp Source 06/06/19 1456 Oral     SpO2 06/06/19 1456 99 %     Weight 06/06/19 1453 225 lb (102.1 kg)     Height 06/06/19 1453 5\' 4"  (1.626 m)     Head Circumference --      Peak Flow --      Pain Score 06/06/19 1453 9  Pain Loc --      Pain Edu? --      Excl. in [place]? --     Constitutional: Alert and oriented. Well appearing and in no acute distress. Eyes: Conjunctivae are normal. ENT      Head: Normocephalic and atraumatic. Cardiovascular: Normal rate, regular rhythm. Grossly normal heart sounds.  Good peripheral circulation. Respiratory: Normal respiratory effort without tachypnea nor retractions. Breath sounds are clear and equal bilaterally. No wheezes, rales, rhonchi. Gastrointestinal: Soft and nontender.No CVA tenderness. Musculoskeletal: Steady gait.  No cervical or lumbar tenderness palpation.  Diffuse midline thoracic and parathoracic bilateral tenderness to palpation, pain  increases with right and left overhead stretch as well as lumbar flexion extension and rotation, overall good range of motion present.  Steady gait.  Changes positions quickly. Neurologic:  Normal speech and language. No gross focal neurologic deficits are appreciated. Speech is normal. No gait instability.  Skin:  Skin is warm, dry and intact. No rash noted. Psychiatric: Mood and affect are normal. Speech and behavior are normal. Patient exhibits appropriate insight and judgment   ___________________________________________   LABS (all labs ordered are listed, but only abnormal results are displayed)  Labs Reviewed - No data to display ____________________________________________   PROCEDURES Procedures    INITIAL IMPRESSION / ASSESSMENT AND PLAN / ED COURSE  Pertinent labs & imaging results that were available during my care of the patient were reviewed by me and considered in my medical decision making (see chart for details).  Well-appearing patient.  No acute distress.  Suspect back strain.  Will treat with Mobic and prn Flexeril.  Encourage rest, fluids, stretching heat and ice.  Discussed indication, risks and benefits of medications with patient. Discussed follow up and return parameters including no resolution or any worsening concerns. Patient verbalized understanding and agreed to plan.   ____________________________________________   FINAL CLINICAL IMPRESSION(S) / ED DIAGNOSES  Final diagnoses:  Back strain, initial encounter     ED Discharge Orders         Ordered    meloxicam (MOBIC) 15 MG tablet  Daily PRN     06/06/19 1522    cyclobenzaprine (FLEXERIL) 10 MG tablet  2 times daily PRN     06/06/19 1522           Note: This dictation was prepared with Dragon dictation along with smaller phrase technology. Any transcriptional errors that result from this process are unintentional.         Marylene Land, NP 06/06/19 1538

## 2019-06-06 NOTE — Discharge Instructions (Addendum)
Take medication as prescribed. Rest. Drink plenty of fluids. Ice/Heat.   Follow up with your primary care physician this week as needed. Return to Urgent care for new or worsening concerns.

## 2019-06-06 NOTE — ED Triage Notes (Signed)
Patient c/o mid back pain that started yesterday. She was moving a file cabinet yesterday and may have pulled a muscle in her back.

## 2019-09-03 DIAGNOSIS — E039 Hypothyroidism, unspecified: Secondary | ICD-10-CM | POA: Diagnosis not present

## 2019-09-07 DIAGNOSIS — E039 Hypothyroidism, unspecified: Secondary | ICD-10-CM | POA: Diagnosis not present

## 2019-10-12 DIAGNOSIS — M5030 Other cervical disc degeneration, unspecified cervical region: Secondary | ICD-10-CM | POA: Insufficient documentation

## 2019-10-12 DIAGNOSIS — S161XXA Strain of muscle, fascia and tendon at neck level, initial encounter: Secondary | ICD-10-CM | POA: Insufficient documentation

## 2019-10-24 DIAGNOSIS — M542 Cervicalgia: Secondary | ICD-10-CM | POA: Insufficient documentation

## 2019-10-24 DIAGNOSIS — M546 Pain in thoracic spine: Secondary | ICD-10-CM | POA: Insufficient documentation

## 2019-11-06 DIAGNOSIS — H9201 Otalgia, right ear: Secondary | ICD-10-CM | POA: Diagnosis not present

## 2019-11-07 DIAGNOSIS — E039 Hypothyroidism, unspecified: Secondary | ICD-10-CM | POA: Diagnosis not present

## 2020-04-24 ENCOUNTER — Encounter: Payer: Self-pay | Admitting: Emergency Medicine

## 2020-04-24 ENCOUNTER — Ambulatory Visit
Admission: EM | Admit: 2020-04-24 | Discharge: 2020-04-24 | Disposition: A | Discharge status: Home / Self Care | Payer: 59 | Attending: Physician Assistant | Admitting: Physician Assistant

## 2020-04-24 ENCOUNTER — Ambulatory Visit: Payer: Self-pay

## 2020-04-24 ENCOUNTER — Other Ambulatory Visit: Payer: Self-pay

## 2020-04-24 DIAGNOSIS — L299 Pruritus, unspecified: Secondary | ICD-10-CM | POA: Insufficient documentation

## 2020-04-24 DIAGNOSIS — L738 Other specified follicular disorders: Secondary | ICD-10-CM | POA: Diagnosis not present

## 2020-04-24 LAB — URINALYSIS, COMPLETE (UACMP) WITH MICROSCOPIC
Bilirubin Urine: NEGATIVE
Glucose, UA: NEGATIVE mg/dL
Hgb urine dipstick: NEGATIVE
Leukocytes,Ua: NEGATIVE
Nitrite: NEGATIVE
Protein, ur: NEGATIVE mg/dL
Specific Gravity, Urine: >1.03 — ABNORMAL HIGH (ref 1.005–1.030)
pH: 5 (ref 5.0–8.0)

## 2020-04-24 LAB — WET PREP, GENITAL
Clue Cells Wet Prep HPF POC: NONE SEEN
Sperm: NONE SEEN
Trich, Wet Prep: NONE SEEN
WBC, Wet Prep HPF POC: NONE SEEN
Yeast Wet Prep HPF POC: NONE SEEN

## 2020-04-24 MED ORDER — MUPIROCIN 2 % EX OINT: 1.0000 "application " | TOPICAL_OINTMENT | Freq: Three times a day (TID) | CUTANEOUS | 0 refills | Status: AC

## 2020-04-24 MED ORDER — HYDROXYZINE HCL 10 MG PO TABS: ORAL_TABLET | ORAL | 0 refills | Status: AC

## 2020-04-24 NOTE — Discharge Instructions (Signed)
Keep the area clean and dry. You can apply the ointment as directed for folliculitis. Can take hydroxyzine as needed for itching. The vaginal swab is normal. The urinalysis also appears normal. I will culture the urine and we will call if you need antibiotics for UTI, but it does not appear to be UTI.

## 2020-04-24 NOTE — ED Provider Notes (Signed)
[facility]    CSN: [id number] Arrival date & time: 04/24/20  1546      History   Chief Complaint Chief Complaint  Patient presents with   Appointment   Dysuria    HPI Alison Logan is a 44 y.o. female presenting for vaginal irritation and dysuria since last night. Patient states that she noticed the intense itching of her vagina after using the hot tub. Admits to one episode of dysuria, none since. No vaginal discharge.  She says there are some small bumps in the area that are not painful but do itch.  She also admits to some redness in the groin region.  Patient states it feels like she has "razor burn."  She has tried Benadryl for the itch.  Patient denies any fever, fatigue, increased urinary frequency urgency, abdominal pain or back pain.  Denies any pelvic pain.  Denies any concern for STIs.  Patient has no history of HSV or genital warts.  No other complaints or concerns today.  HPI  Past Medical History:  Diagnosis Date   Migraines    Thyroid disease     There are no problems to display for this patient.   Past Surgical History:  Procedure Laterality Date   THYROIDECTOMY Bilateral     OB History   No obstetric history on file.      Home Medications    Prior to Admission medications   Medication Sig Start Date End Date Taking? Authorizing Provider  albuterol (PROVENTIL HFA;VENTOLIN HFA) 108 (90 Base) MCG/ACT inhaler Inhale 1-2 puffs into the lungs every 6 (six) hours as needed for wheezing or shortness of breath. 03/24/18  Yes Cook, Jayce G, DO  beclomethasone (QVAR) 40 MCG/ACT inhaler Inhale 1 puff into the lungs 2 (two) times daily. 03/24/18  Yes Cook, Jayce G, DO  levothyroxine (SYNTHROID, LEVOTHROID) 112 MCG tablet Take 112 mcg by mouth daily before breakfast.   Yes [provider]  liothyronine (CYTOMEL) 5 MCG tablet Take by mouth. 02/08/18 04/24/20 Yes [provider]  cyclobenzaprine (FLEXERIL) 10 MG tablet Take 1  tablet (10 mg total) by mouth 2 (two) times daily as needed for muscle spasms. Do not drive while taking as can cause drowsiness 06/06/19   Marylene Land, NP  hydrOXYzine (ATARAX/VISTARIL) 10 MG tablet Take 1 to 2 tablets by mouth every 4-6 hours as needed for itching 04/24/20 05/01/20  Danton Clap, PA-C  meloxicam (MOBIC) 15 MG tablet Take 1 tablet (15 mg total) by mouth daily as needed. 06/06/19   Marylene Land, NP  mupirocin ointment (BACTROBAN) 2 % Apply 1 application topically 3 (three) times daily for 7 days. 04/24/20 05/01/20  Danton Clap, PA-C  UBRELVY 100 MG TABS  05/27/19   [provider]    Family History Family History  Problem Relation Age of Onset   Healthy Mother    Healthy Father     Social History Social History   Tobacco Use   Smoking status: Never Smoker   Smokeless tobacco: Never Used  Scientific laboratory technician Use: Never used  Substance Use Topics   Alcohol use: Yes   Drug use: Never     Allergies   Patient has no known allergies.   Review of Systems Review of Systems  Constitutional: Negative for chills and fever.  Gastrointestinal: Negative for abdominal pain, diarrhea, nausea and vomiting.  Genitourinary: Positive for dysuria. Negative for decreased urine volume, flank pain, frequency, hematuria, pelvic pain, urgency, vaginal bleeding,  vaginal discharge and vaginal pain.       Vaginal itching and burning  Musculoskeletal: Negative for back pain.  Skin: Negative for rash.     Physical Exam Triage Vital Signs ED Triage Vitals  Enc Vitals Group     BP 04/24/20 1627 (!) 113/98     Pulse Rate 04/24/20 1627 84     Resp 04/24/20 1627 18     Temp 04/24/20 1627 98.3 F (36.8 C)     Temp Source 04/24/20 1627 Oral     SpO2 04/24/20 1627 100 %     Weight 04/24/20 1624 225 lb 1.4 oz (102.1 kg)     Height 04/24/20 1624 5\' 4"  (1.626 m)     Head Circumference --      Peak Flow --      Pain Score 04/24/20 1624 8     Pain Loc --       Pain Edu? --      Excl. in [place]? --    No data found.  Updated Vital Signs BP (!) 113/98 (BP Location: Left Arm)    Pulse 84    Temp 98.3 F (36.8 C) (Oral)    Resp 18    Ht 5\' 4"  (1.626 m)    Wt 225 lb 1.4 oz (102.1 kg)    LMP 03/31/2020 (Approximate)    SpO2 100%    BMI 38.64 kg/m       Physical Exam Vitals and nursing note reviewed.  Constitutional:      General: She is not in acute distress.    Appearance: Normal appearance. She is not ill-appearing or toxic-appearing.  HENT:     Head: Normocephalic and atraumatic.  Eyes:     General: No scleral icterus.       Right eye: No discharge.        Left eye: No discharge.     Conjunctiva/sclera: Conjunctivae normal.  Cardiovascular:     Rate and Rhythm: Normal rate and regular rhythm.     Heart sounds: Normal heart sounds.  Pulmonary:     Effort: Pulmonary effort is normal. No respiratory distress.     Breath sounds: Normal breath sounds.  Genitourinary:    Exam position: Supine.     Labia:        Right: No rash, tenderness or lesion.        Left: No rash, tenderness or lesion.      Vagina: Normal.     Comments: Few scattered small pustules on mildly erythematous base mons pubis. Non tender Musculoskeletal:     Cervical back: Neck supple.  Skin:    General: Skin is dry.  Neurological:     General: No focal deficit present.     Mental Status: She is alert. Mental status is at baseline.     Motor: No weakness.     Gait: Gait normal.  Psychiatric:        Mood and Affect: Mood normal.        Behavior: Behavior normal.        Thought Content: Thought content normal.      UC Treatments / Results  Labs (all labs ordered are listed, but only abnormal results are displayed) Labs Reviewed  URINE CULTURE - Abnormal; Notable for the following components:      Result Value   Culture   (*)    Value: <10,000 COLONIES/mL INSIGNIFICANT GROWTH Performed at [facility], [address], [place], [place] [address]  All other components within normal limits  URINALYSIS, COMPLETE (UACMP) WITH MICROSCOPIC - Abnormal; Notable for the following components:   APPearance HAZY (*)    Specific Gravity, Urine >1.030 (*)    Ketones, ur TRACE (*)    Bacteria, UA FEW (*)    All other components within normal limits  WET PREP, GENITAL    EKG   Radiology No results found.  Procedures Procedures (including critical care time)  Medications Ordered in UC Medications - No data to display  Initial Impression / Assessment and Plan / UC Course  I have reviewed the triage vital signs and the nursing notes.  Pertinent labs & imaging results that were available during my care of the patient were reviewed by me and considered in my medical decision making (see chart for details).   Wet prep is within normal limits.  Urinalysis shows trace ketones and increased [place] gravity, otherwise normal.  Urine sent for culture, but do not suspect UTI.  Based on patient exam suspect hot tub folliculitis.  I am prescribing mupirocin ointment as well as hydroxyzine tablets for the itching.  Advised not to scratch.  Advised to follow-up as needed for any new or worsening symptoms.  Final Clinical Impressions(s) / UC Diagnoses   Final diagnoses:  Hot tub folliculitis  Itching     Discharge Instructions     Keep the area clean and dry. You can apply the ointment as directed for folliculitis. Can take hydroxyzine as needed for itching. The vaginal swab is normal. The urinalysis also appears normal. I will culture the urine and we will call if you need antibiotics for UTI, but it does not appear to be UTI.    ED Prescriptions    Medication Sig Dispense Auth. Provider   mupirocin ointment (BACTROBAN) 2 % Apply 1 application topically 3 (three) times daily for 7 days. 22 g Laurene Footman B, PA-C   hydrOXYzine (ATARAX/VISTARIL) 10 MG tablet Take 1 to 2 tablets by mouth every 4-6 hours as needed for itching 30 tablet Danton Clap, PA-C     PDMP not reviewed this encounter.   Danton Clap, PA-C 04/28/20 [phone]

## 2020-04-24 NOTE — ED Triage Notes (Signed)
Pt c/o dysuria and vaginal irritation. Started last night. She states she has a hot tub and was int he hot tub last night. She states it feels like razor burn. Denies vaginal discharge. She states the redness and irritation is also in the crests of her legs.

## 2020-04-26 LAB — URINE CULTURE: Culture: <10000 — AB

## 2020-06-02 DIAGNOSIS — Z20822 Contact with and (suspected) exposure to covid-19: Secondary | ICD-10-CM | POA: Diagnosis not present

## 2020-10-08 ENCOUNTER — Ambulatory Visit
Admission: RE | Admit: 2020-10-08 | Discharge: 2020-10-08 | Disposition: A | Discharge status: Home / Self Care | Payer: 59 | Source: Ambulatory Visit | Attending: Family Medicine | Admitting: Family Medicine

## 2020-10-08 ENCOUNTER — Other Ambulatory Visit: Payer: Self-pay

## 2020-10-08 VITALS — BP 108/54 | HR 60 | Temp 98.3°F | Resp 19

## 2020-10-08 DIAGNOSIS — J069 Acute upper respiratory infection, unspecified: Secondary | ICD-10-CM | POA: Insufficient documentation

## 2020-10-08 DIAGNOSIS — Z20822 Contact with and (suspected) exposure to covid-19: Secondary | ICD-10-CM | POA: Diagnosis not present

## 2020-10-08 DIAGNOSIS — Z28311 Partially vaccinated for covid-19: Secondary | ICD-10-CM | POA: Diagnosis not present

## 2020-10-08 DIAGNOSIS — R059 Cough, unspecified: Secondary | ICD-10-CM

## 2020-10-08 DIAGNOSIS — Z7951 Long term (current) use of inhaled steroids: Secondary | ICD-10-CM | POA: Diagnosis not present

## 2020-10-08 DIAGNOSIS — R0981 Nasal congestion: Secondary | ICD-10-CM | POA: Diagnosis not present

## 2020-10-08 DIAGNOSIS — R051 Acute cough: Secondary | ICD-10-CM | POA: Insufficient documentation

## 2020-10-08 DIAGNOSIS — Z79890 Hormone replacement therapy (postmenopausal): Secondary | ICD-10-CM | POA: Insufficient documentation

## 2020-10-08 DIAGNOSIS — J45909 Unspecified asthma, uncomplicated: Secondary | ICD-10-CM | POA: Diagnosis not present

## 2020-10-08 LAB — SARS CORONAVIRUS 2 (TAT 6-24 HRS): SARS Coronavirus 2: NEGATIVE

## 2020-10-08 MED ORDER — PSEUDOEPH-BROMPHEN-DM 30-2-10 MG/5ML PO SYRP: 10.0000 mL | ORAL_SOLUTION | Freq: Four times a day (QID) | ORAL | 0 refills | Status: DC | PRN

## 2020-10-08 NOTE — ED Triage Notes (Signed)
Pt presents with complaints of cough, chest congestion, and runny nose since Saturday. Pt also endorses left eye drainage. Pt denies fever. Reports no relief with otc medications.

## 2020-10-08 NOTE — ED Provider Notes (Signed)
[facility]    CSN: [id number] Arrival date & time: 10/08/20  1145      History   Chief Complaint Chief Complaint  Patient presents with  . Appointment  . Cough    HPI Alison Logan is a 45 y.o. female presenting for 3 to 4-day history of cough, nasal congestion, postnasal drainage, fatigue.  She also admits to some pain in the center of her back.  Additionally, she says that her left eye feels weird.  Denies any drainage or redness.  Patient denies any fevers, weakness, wheezing, shortness of breath, abdominal pain, nausea/vomiting or diarrhea.  Patient says that her husband and daughter are ill with similar symptoms.  Patient denies any known COVID exposure.  Has been vaccinated with [place] but no booster.  Not taking any over-the-counter medication for symptoms.  Electronic medical record does show that patient has history of asthma.  Patient denies any other complaints or concerns today.  HPI  Past Medical History:  Diagnosis Date  . Migraines   . Thyroid disease     There are no problems to display for this patient.   Past Surgical History:  Procedure Laterality Date  . THYROIDECTOMY Bilateral     OB History   No obstetric history on file.      Home Medications    Prior to Admission medications   Medication Sig Start Date End Date Taking? Authorizing Provider  brompheniramine-pseudoephedrine-DM 30-2-10 MG/5ML syrup Take 10 mLs by mouth 4 (four) times daily as needed for up to 7 days. 10/08/20 10/15/20 Yes Laurene Footman B, PA-C  albuterol (PROVENTIL HFA;VENTOLIN HFA) 108 (90 Base) MCG/ACT inhaler Inhale 1-2 puffs into the lungs every 6 (six) hours as needed for wheezing or shortness of breath. 03/24/18   Coral Spikes, DO  beclomethasone (QVAR) 40 MCG/ACT inhaler Inhale 1 puff into the lungs 2 (two) times daily. 03/24/18   Coral Spikes, DO  cyclobenzaprine (FLEXERIL) 10 MG tablet Take 1 tablet (10 mg total) by mouth 2 (two)  times daily as needed for muscle spasms. Do not drive while taking as can cause drowsiness 06/06/19   Marylene Land, NP  levothyroxine (SYNTHROID, LEVOTHROID) 112 MCG tablet Take 112 mcg by mouth daily before breakfast.    [provider]  liothyronine (CYTOMEL) 5 MCG tablet Take by mouth. 02/08/18 04/24/20  [provider]  meloxicam (MOBIC) 15 MG tablet Take 1 tablet (15 mg total) by mouth daily as needed. 06/06/19   Marylene Land, NP  UBRELVY 100 MG TABS  05/27/19   [provider]    Family History Family History  Problem Relation Age of Onset  . Healthy Mother   . Healthy Father     Social History Social History   Tobacco Use  . Smoking status: Never Smoker  . Smokeless tobacco: Never Used  Vaping Use  . Vaping Use: Never used  Substance Use Topics  . Alcohol use: Yes  . Drug use: Never     Allergies   Patient has no known allergies.   Review of Systems Review of Systems  Constitutional: Negative for chills, diaphoresis, fatigue and fever.  HENT: Positive for congestion and rhinorrhea. Negative for ear pain, sinus pressure, sinus pain and sore throat.   Eyes: Negative for discharge and redness.  Respiratory: Positive for cough. Negative for shortness of breath.   Cardiovascular: Negative for chest pain.  Gastrointestinal: Negative for abdominal pain, nausea and vomiting.  Musculoskeletal: Positive for back pain.  Negative for arthralgias and myalgias.  Skin: Negative for rash.  Neurological: Negative for weakness and headaches.  Hematological: Negative for adenopathy.     Physical Exam Triage Vital Signs ED Triage Vitals  Enc Vitals Group     BP 10/08/20 1212 (!) 108/54     Pulse Rate 10/08/20 1212 60     Resp 10/08/20 1212 19     Temp 10/08/20 1212 98.3 F (36.8 C)     Temp src --      SpO2 10/08/20 1212 99 %     Weight --      Height --      Head Circumference --      Peak Flow --      Pain Score 10/08/20 1211 6     Pain  Loc --      Pain Edu? --      Excl. in [place]? --    No data found.  Updated Vital Signs BP (!) 108/54   Pulse 60   Temp 98.3 F (36.8 C)   Resp 19   LMP 09/17/2020   SpO2 99%    Physical Exam Vitals and nursing note reviewed.  Constitutional:      General: She is not in acute distress.    Appearance: Normal appearance. She is not ill-appearing or toxic-appearing.  HENT:     Head: Normocephalic and atraumatic.     Right Ear: Tympanic membrane, ear canal and external ear normal.     Left Ear: Tympanic membrane, ear canal and external ear normal.     Nose: Congestion and rhinorrhea present.     Mouth/Throat:     Mouth: Mucous membranes are moist.     Pharynx: Oropharynx is clear.  Eyes:     General: No scleral icterus.       Right eye: No discharge.        Left eye: No discharge.     Conjunctiva/sclera: Conjunctivae normal.  Cardiovascular:     Rate and Rhythm: Normal rate and regular rhythm.     Heart sounds: Normal heart sounds.  Pulmonary:     Effort: Pulmonary effort is normal. No respiratory distress.     Breath sounds: Normal breath sounds.  Musculoskeletal:     Cervical back: Neck supple.  Skin:    General: Skin is dry.  Neurological:     General: No focal deficit present.     Mental Status: She is alert. Mental status is at baseline.     Motor: No weakness.     Gait: Gait normal.  Psychiatric:        Mood and Affect: Mood normal.        Behavior: Behavior normal.        Thought Content: Thought content normal.      UC Treatments / Results  Labs (all labs ordered are listed, but only abnormal results are displayed) Labs Reviewed  SARS CORONAVIRUS 2 (TAT 6-24 HRS)    EKG   Radiology No results found.  Procedures Procedures (including critical care time)  Medications Ordered in UC Medications - No data to display  Initial Impression / Assessment and Plan / UC Course  I have reviewed the triage vital signs and the nursing notes.  Pertinent  labs & imaging results that were available during my care of the patient were reviewed by me and considered in my medical decision making (see chart for details).   45 year old female presenting with 4-day history of cough and congestion.  Also complains of her left eye feeling weird but no evidence or sign of conjunctivitis on exam.  Vital signs are all stable.  She is overall well-appearing.  Chest is clear to auscultation heart regular rate rhythm.  Molecular COVID test obtained.  Current CDC guidelines, isolation protocol and ED precautions reviewed with patient.  Advised this is likely a viral URI.  She says that her husband and daughter have similar symptoms any "upper respiratory infection is going around my husband's workplace."  Advise supportive care at this time with increasing rest and fluids.  Sent in Bromfed-DM.  Advised to follow-up as needed for any worsening symptoms or if she is not feeling better after 10 days.  Work note provided.   Final Clinical Impressions(s) / UC Diagnoses   Final diagnoses:  Viral upper respiratory tract infection  Cough  Nasal congestion     Discharge Instructions     URI/COLD SYMPTOMS: Your exam today is consistent with a viral illness. Antibiotics are not indicated at this time. Use medications as directed, including cough syrup, nasal saline, and decongestants. Your symptoms should improve over the next few days and resolve within 7-10 days. Increase rest and fluids. F/u if symptoms worsen or predominate such as sore throat, ear pain, productive cough, shortness of breath, or if you develop high fevers or worsening fatigue over the next several days.    You have received COVID testing today either for positive exposure, concerning symptoms that could be related to COVID infection, screening purposes, or re-testing after confirmed positive.  Your test obtained today checks for active viral infection in the last 1-2 weeks. If your test is negative  now, you can still test positive later. So, if you do develop symptoms you should either get re-tested and/or isolate x 5 days and then strict mask use x 5 days (unvaccinated) or mask use x 10 days (vaccinated). Please follow CDC guidelines.  While Rapid antigen tests come back in 15-20 minutes, send out PCR/molecular test results typically come back within 1-3 days. In the mean time, if you are symptomatic, assume this could be a positive test and treat/monitor yourself as if you do have COVID.   We will call with test results if positive. Please download the MyChart app and set up a profile to access test results.   If symptomatic, go home and rest. Push fluids. Take Tylenol as needed for discomfort. Gargle warm salt water. Throat lozenges. Take Mucinex DM or Robitussin for cough. Humidifier in bedroom to ease coughing. Warm showers. Also review the COVID handout for more information.  COVID-19 INFECTION: The incubation period of COVID-19 is approximately 14 days after exposure, with most symptoms developing in roughly 4-5 days. Symptoms may range in severity from mild to critically severe. Roughly 80% of those infected will have mild symptoms. People of any age may become infected with COVID-19 and have the ability to transmit the virus. The most common symptoms include: fever, fatigue, cough, body aches, headaches, sore throat, nasal congestion, shortness of breath, nausea, vomiting, diarrhea, changes in smell and/or taste.    COURSE OF ILLNESS Some patients may begin with mild disease which can progress quickly into critical symptoms. If your symptoms are worsening please call ahead to the Emergency Department and proceed there for further treatment. Recovery time appears to be roughly 1-2 weeks for mild symptoms and 3-6 weeks for severe disease.   GO IMMEDIATELY TO ER FOR FEVER YOU ARE UNABLE TO GET DOWN WITH TYLENOL, BREATHING PROBLEMS, CHEST  PAIN, FATIGUE, LETHARGY, INABILITY TO EAT OR DRINK,  ETC  QUARANTINE AND ISOLATION: To help decrease the spread of COVID-19 please remain isolated if you have COVID infection or are highly suspected to have COVID infection. This means -stay home and isolate to one room in the home if you live with others. Do not share a bed or bathroom with others while ill, sanitize and wipe down all countertops and keep common areas clean and disinfected. Stay home for 5 days. If you have no symptoms or your symptoms are resolving after 5 days, you can leave your house. Continue to wear a mask around others for 5 additional days. If you have been in close contact (within 6 feet) of someone diagnosed with COVID 19, you are advised to quarantine in your home for 14 days as symptoms can develop anywhere from 2-14 days after exposure to the virus. If you develop symptoms, you  must isolate.  Most current guidelines for COVID after exposure -unvaccinated: isolate 5 days and strict mask use x 5 days. Test on day 5 is possible -vaccinated: wear mask x 10 days if symptoms do not develop -You do not necessarily need to be tested for COVID if you have + exposure and  develop symptoms. Just isolate at home x10 days from symptom onset During this global pandemic, CDC advises to practice social distancing, try to stay at least 59ft away from others at all times. Wear a face covering. Wash and sanitize your hands regularly and avoid going anywhere that is not necessary.  KEEP IN MIND THAT THE COVID TEST IS NOT 100% ACCURATE AND YOU SHOULD STILL DO EVERYTHING TO PREVENT POTENTIAL SPREAD OF VIRUS TO OTHERS (WEAR MASK, WEAR GLOVES, [place] HANDS AND SANITIZE REGULARLY). IF INITIAL TEST IS NEGATIVE, THIS MAY NOT MEAN YOU ARE DEFINITELY NEGATIVE. MOST ACCURATE TESTING IS DONE 5-7 DAYS AFTER EXPOSURE.   It is not advised by CDC to get re-tested after receiving a positive COVID test since you can still test positive for weeks to months after you have already cleared the virus.   *If you have  not been vaccinated for COVID, I strongly suggest you consider getting vaccinated as long as there are no contraindications.      ED Prescriptions    Medication Sig Dispense Auth. Provider   brompheniramine-pseudoephedrine-DM 30-2-10 MG/5ML syrup Take 10 mLs by mouth 4 (four) times daily as needed for up to 7 days. 150 mL Danton Clap, PA-C     PDMP not reviewed this encounter.   Danton Clap, PA-C 10/08/20 1251

## 2020-10-08 NOTE — Discharge Instructions (Signed)

## 2020-10-15 ENCOUNTER — Ambulatory Visit
Admission: RE | Admit: 2020-10-15 | Discharge: 2020-10-15 | Disposition: A | Discharge status: Home / Self Care | Payer: 59 | Source: Ambulatory Visit | Attending: Internal Medicine | Admitting: Internal Medicine

## 2020-10-15 ENCOUNTER — Ambulatory Visit: Payer: Self-pay

## 2020-10-15 ENCOUNTER — Other Ambulatory Visit: Payer: Self-pay

## 2020-10-15 VITALS — BP 123/43 | HR 53 | Temp 98.1°F | Resp 18 | Ht 64.0 in | Wt 225.1 lb

## 2020-10-15 DIAGNOSIS — S29019A Strain of muscle and tendon of unspecified wall of thorax, initial encounter: Secondary | ICD-10-CM

## 2020-10-15 DIAGNOSIS — J40 Bronchitis, not specified as acute or chronic: Secondary | ICD-10-CM | POA: Diagnosis not present

## 2020-10-15 DIAGNOSIS — J339 Nasal polyp, unspecified: Secondary | ICD-10-CM | POA: Diagnosis not present

## 2020-10-15 DIAGNOSIS — R519 Headache, unspecified: Secondary | ICD-10-CM

## 2020-10-15 MED ORDER — PREDNISONE 50 MG PO TABS: ORAL_TABLET | ORAL | 0 refills | Status: DC

## 2020-10-15 MED ORDER — CYCLOBENZAPRINE HCL 10 MG PO TABS: 10.0000 mg | ORAL_TABLET | Freq: Every day | ORAL | 0 refills | Status: DC

## 2020-10-15 MED ORDER — ALBUTEROL SULFATE HFA 108 (90 BASE) MCG/ACT IN AERS: 2.0000 | INHALATION_SPRAY | Freq: Four times a day (QID) | RESPIRATORY_TRACT | 2 refills | Status: DC | PRN

## 2020-10-15 MED ORDER — AMOXICILLIN-POT CLAVULANATE 875-125 MG PO TABS: 1.0000 | ORAL_TABLET | Freq: Two times a day (BID) | ORAL | 0 refills | Status: DC

## 2020-10-15 NOTE — ED Triage Notes (Signed)
Patient states that she has been having sinus pain and pressure since 05/28. Reports that she was seen here on 6/1 and given a cough medication. States that she has not improved since last visit and would like to have an antibiotic.

## 2020-10-15 NOTE — ED Provider Notes (Signed)
[facility]    CSN: [id number] Arrival date & time: 10/15/20  1246      History   Chief Complaint Chief Complaint  Patient presents with  . Cough    HPI Alison Logan is a 45 y.o. female who presents requesting an antibiotic to treat her sinuses. She feels she has an infection. Has had URI  And sinus pressure since 5/28. She was seen here on 6/1  And prescribed cough medication with sudafed. Has hx of having sinus surgery which helped for 6 months and then chronic sinus issues came back off and on. Has not had a fever. Used to see ENT in [place]. States she feels she stays stuffy and though she blows her nose she does not seem to stop getting stuffy.     Past Medical History:  Diagnosis Date  . Migraines   . Thyroid disease     There are no problems to display for this patient.   Past Surgical History:  Procedure Laterality Date  . THYROIDECTOMY Bilateral     OB History   No obstetric history on file.      Home Medications    Prior to Admission medications   Medication Sig Start Date End Date Taking? Authorizing Provider  albuterol (VENTOLIN HFA) 108 (90 Base) MCG/ACT inhaler Inhale 2 puffs into the lungs every 6 (six) hours as needed for wheezing or shortness of breath. 10/15/20  Yes Rodriguez-Southworth, Sunday Spillers, PA-C  amoxicillin-clavulanate (AUGMENTIN) 875-125 MG tablet Take 1 tablet by mouth 2 (two) times daily. 10/15/20  Yes Rodriguez-Southworth, Sunday Spillers, PA-C  cyclobenzaprine (FLEXERIL) 10 MG tablet Take 1 tablet (10 mg total) by mouth at bedtime. For muscle spasms 10/15/20  Yes Rodriguez-Southworth, Sunday Spillers, PA-C  levothyroxine (SYNTHROID, LEVOTHROID) 112 MCG tablet Take 112 mcg by mouth daily before breakfast.   Yes [provider]  predniSONE (DELTASONE) 50 MG tablet Take as directed 10/15/20  Yes Rodriguez-Southworth, Sunday Spillers, PA-C  UBRELVY 100 MG TABS  05/27/19  Yes [provider]  liothyronine (CYTOMEL) 5 MCG tablet Take by mouth.  02/08/18 04/24/20  [provider]    Family History Family History  Problem Relation Age of Onset  . Healthy Mother   . Healthy Father     Social History Social History   Tobacco Use  . Smoking status: Never Smoker  . Smokeless tobacco: Never Used  Vaping Use  . Vaping Use: Never used  Substance Use Topics  . Alcohol use: Yes  . Drug use: Never     Allergies   Patient has no known allergies.   Review of Systems Review of Systems  Constitutional: Positive for fatigue. Negative for activity change, chills, diaphoresis and fever.  HENT: Positive for congestion, ear pain, postnasal drip and sinus pressure. Negative for ear discharge and sore throat.   Eyes: Negative for discharge.  Respiratory: Positive for cough and wheezing.   Gastrointestinal: Negative for diarrhea, nausea and vomiting.  Musculoskeletal: Positive for back pain.       Mid thorax area for the past week, could be from her cough  Skin: Negative for rash.  Hematological: Negative for adenopathy.     Physical Exam Triage Vital Signs ED Triage Vitals  Enc Vitals Group     BP 10/15/20 1319 (!) 123/43     Pulse Rate 10/15/20 1319 (!) 53     Resp 10/15/20 1319 18     Temp 10/15/20 1319 98.1 F (36.7 C)     Temp Source 10/15/20 1319 Oral  SpO2 10/15/20 1319 100 %     Weight 10/15/20 1317 225 lb 1.4 oz (102.1 kg)     Height 10/15/20 1317 5\' 4"  (1.626 m)     Head Circumference --      Peak Flow --      Pain Score 10/15/20 1316 8     Pain Loc --      Pain Edu? --      Excl. in [employer]? --    No data found.  Updated Vital Signs BP (!) 123/43 (BP Location: Left Arm)   Pulse (!) 53   Temp 98.1 F (36.7 C) (Oral)   Resp 18   Ht 5\' 4"  (1.626 m)   Wt 225 lb 1.4 oz (102.1 kg)   LMP 10/14/2020   SpO2 100%   BMI 38.64 kg/m   Visual Acuity Right Eye Distance:   Left Eye Distance:   Bilateral Distance:    Right Eye Near:   Left Eye Near:    Bilateral Near:     Physical Exam Vitals  and nursing note reviewed.  Constitutional:      General: She is not in acute distress.    Appearance: She is obese. She is not toxic-appearing.  HENT:     Head: Normocephalic.     Right Ear: Tympanic membrane, ear canal and external ear normal.     Left Ear: Tympanic membrane, ear canal and external ear normal.     Nose:     Comments: With gray growth on the L nostril which looks like a polyp, R side is normal     Mouth/Throat:     Mouth: Mucous membranes are moist.  Eyes:     Conjunctiva/sclera: Conjunctivae normal.  Cardiovascular:     Rate and Rhythm: Regular rhythm.     Heart sounds: No murmur heard.   Pulmonary:     Effort: Pulmonary effort is normal.     Breath sounds: Wheezing present.  Musculoskeletal:        General: Normal range of motion.     Cervical back: Neck supple.     Comments: BACK- has tenderness on upper mid thorax muscle region and spine area with palpation  Skin:    General: Skin is warm and dry.     Findings: No rash.  Neurological:     Mental Status: She is alert and oriented to person, place, and time.     Gait: Gait normal.  Psychiatric:        Mood and Affect: Mood normal.        Behavior: Behavior normal.        Thought Content: Thought content normal.        Judgment: Judgment normal.      UC Treatments / Results  Labs (all labs ordered are listed, but only abnormal results are displayed) Labs Reviewed - No data to display  EKG   Radiology No results found.  Procedures Procedures (including critical care time)  Medications Ordered in UC Medications - No data to display  Initial Impression / Assessment and Plan / UC Course  I have reviewed the triage vital signs and the nursing notes. Has sinus HA and inflammation. L side nose polyp and bronchitis. I placed her on Augmentin which is what her ENT placed her on. Albuterol inhaler and deltasone as noted. Flexeril for her thoracic strain. Needs to FU with ENT.  Final Clinical  Impressions(s) / UC Diagnoses   Final diagnoses:  Bronchitis  Nose polyp  Sinus headache  Thoracic myofascial strain, initial encounter     Discharge Instructions     Follow up with ENT in the next few weeks     ED Prescriptions    Medication Sig Dispense Auth. Provider   predniSONE (DELTASONE) 50 MG tablet Take as directed 5 tablet Rodriguez-Southworth, Carrolyn Hilmes, PA-C   amoxicillin-clavulanate (AUGMENTIN) 875-125 MG tablet Take 1 tablet by mouth 2 (two) times daily. 14 tablet Rodriguez-Southworth, Sunday Spillers, PA-C   albuterol (VENTOLIN HFA) 108 (90 Base) MCG/ACT inhaler Inhale 2 puffs into the lungs every 6 (six) hours as needed for wheezing or shortness of breath. 8 g Rodriguez-Southworth, Sunday Spillers, PA-C   cyclobenzaprine (FLEXERIL) 10 MG tablet Take 1 tablet (10 mg total) by mouth at bedtime. For muscle spasms 10 tablet Rodriguez-Southworth, Sunday Spillers, PA-C     PDMP not reviewed this encounter.   Shelby Mattocks, [place] 10/15/20 2052

## 2020-10-15 NOTE — Discharge Instructions (Addendum)
Follow up with ENT in the next few weeks

## 2020-11-16 DIAGNOSIS — R21 Rash and other nonspecific skin eruption: Secondary | ICD-10-CM | POA: Diagnosis not present

## 2020-11-18 DIAGNOSIS — J310 Chronic rhinitis: Secondary | ICD-10-CM | POA: Diagnosis not present

## 2020-11-18 DIAGNOSIS — J328 Other chronic sinusitis: Secondary | ICD-10-CM | POA: Diagnosis not present

## 2021-02-11 ENCOUNTER — Other Ambulatory Visit: Payer: Self-pay

## 2021-02-11 ENCOUNTER — Emergency Department: Payer: 59

## 2021-02-11 ENCOUNTER — Emergency Department
Admission: EM | Admit: 2021-02-11 | Discharge: 2021-02-11 | Disposition: A | Discharge status: Home / Self Care | Payer: 59 | Attending: Emergency Medicine | Admitting: Emergency Medicine

## 2021-02-11 ENCOUNTER — Ambulatory Visit
Admission: EM | Admit: 2021-02-11 | Discharge: 2021-02-11 | Disposition: A | Discharge status: Other Acute Inpatient Hospital | Payer: 59

## 2021-02-11 DIAGNOSIS — W260XXA Contact with knife, initial encounter: Secondary | ICD-10-CM | POA: Insufficient documentation

## 2021-02-11 DIAGNOSIS — S6992XA Unspecified injury of left wrist, hand and finger(s), initial encounter: Secondary | ICD-10-CM | POA: Diagnosis present

## 2021-02-11 DIAGNOSIS — S61012A Laceration without foreign body of left thumb without damage to nail, initial encounter: Secondary | ICD-10-CM | POA: Diagnosis not present

## 2021-02-11 DIAGNOSIS — S61112A Laceration without foreign body of left thumb with damage to nail, initial encounter: Secondary | ICD-10-CM

## 2021-02-11 MED ORDER — LIDOCAINE HCL (PF) 1 % IJ SOLN
5.0000 mL | Freq: Once | INTRAMUSCULAR | Status: AC
  Administered 2021-02-11: 5 mL
  Filled 2021-02-11: qty 5

## 2021-02-11 NOTE — ED Provider Notes (Signed)
Patient presents with 1 cm laceration of distal left thumb due to accidental cut from knife. Laceration is throughout nail and nailbed. Good strength and sensation. Complicated laceration. Brief assessment by me. Patient sent to ED for laceration repair.   Danton Clap, PA-C 02/11/21 1146

## 2021-02-11 NOTE — ED Notes (Signed)
Patient stable and discharged with all personal belongings and AVS. AVS and discharge instructions reviewed with patient and opportunity for questions provided.   

## 2021-02-11 NOTE — ED Triage Notes (Signed)
Pt here with C/O left thumb laceration, was opening a bag of cheese, was using knife slipped and cut thumb.

## 2021-02-11 NOTE — ED Provider Notes (Signed)
[facility] Emergency Department Provider Note   ____________________________________________   Event Date/Time   First MD Initiated Contact with Patient 02/11/21 1453     (approximate)  I have reviewed the triage vital signs and the nursing notes.   HISTORY  Chief Complaint Laceration    HPI Alison Logan is a 45 y.o. female who presents for a left thumb laceration  LOCATION: Distal left thumb DURATION: Occurred just prior to arrival TIMING: States throbbing has been worsening SEVERITY: Moderate QUALITY: Laceration CONTEXT: Patient states that she accidentally cut the distal portion of the left thumb with a knife through the tip and partially into the nailbed MODIFYING FACTORS: Any palpation or movement of thumb worsens this pain and is partially relieved at rest ASSOCIATED SYMPTOMS: Denies   Per medical record review, migraines and thyroid disease          Past Medical History:  Diagnosis Date   Migraines    Thyroid disease     There are no problems to display for this patient.   Past Surgical History:  Procedure Laterality Date   THYROIDECTOMY Bilateral     Prior to Admission medications   Medication Sig Start Date End Date Taking? Authorizing Provider  albuterol (VENTOLIN HFA) 108 (90 Base) MCG/ACT inhaler Inhale 2 puffs into the lungs every 6 (six) hours as needed for wheezing or shortness of breath. 10/15/20   Rodriguez-Southworth, Sunday Spillers, PA-C  amoxicillin-clavulanate (AUGMENTIN) 875-125 MG tablet Take 1 tablet by mouth 2 (two) times daily. 10/15/20   Rodriguez-Southworth, Sunday Spillers, PA-C  cyclobenzaprine (FLEXERIL) 10 MG tablet Take 1 tablet (10 mg total) by mouth at bedtime. For muscle spasms 10/15/20   Rodriguez-Southworth, Sunday Spillers, PA-C  levothyroxine (SYNTHROID, LEVOTHROID) 112 MCG tablet Take 112 mcg by mouth daily before breakfast.    [provider]  liothyronine (CYTOMEL) 5 MCG tablet Take by mouth. 02/08/18  04/24/20  [provider]  predniSONE (DELTASONE) 50 MG tablet Take as directed 10/15/20   Rodriguez-Southworth, Sunday Spillers, PA-C  UBRELVY 100 MG TABS  05/27/19   [provider]    Allergies Patient has no known allergies.  Family History  Problem Relation Age of Onset   Healthy Mother    Healthy Father     Social History Social History   Tobacco Use   Smoking status: Never   Smokeless tobacco: Never  Vaping Use   Vaping Use: Never used  Substance Use Topics   Alcohol use: Yes   Drug use: Never    Review of Systems Constitutional: No fever/chills Eyes: No visual changes. ENT: No sore throat. Cardiovascular: Denies chest pain. Respiratory: Denies shortness of breath. Gastrointestinal: No abdominal pain.  No nausea, no vomiting.  No diarrhea. Genitourinary: Negative for dysuria. Musculoskeletal: Positive for acute left thumb pain with associated laceration Skin: Negative for rash. Neurological: Negative for headaches, weakness/numbness/paresthesias in any extremity Psychiatric: Negative for suicidal ideation/homicidal ideation   ____________________________________________   PHYSICAL EXAM:  VITAL SIGNS: ED Triage Vitals [02/11/21 1317]  Enc Vitals Group     BP (!) 136/91     Pulse Rate (!) 110     Resp 18     Temp 97.9 F (36.6 C)     Temp Source Oral     SpO2 97 %     Weight 196 lb 3.4 oz (89 kg)     Height 5\' 4"  (1.626 m)     Head Circumference      Peak Flow      Pain  Score 9     Pain Loc      Pain Edu?      Excl. in [place]?    Constitutional: Alert and oriented. Well appearing and in no acute distress. Eyes: Conjunctivae are normal. PERRL. Head: Atraumatic. Nose: No congestion/rhinnorhea. Mouth/Throat: Mucous membranes are moist. Neck: No stridor Cardiovascular: Grossly normal heart sounds.  Good peripheral circulation. Respiratory: Normal respiratory effort.  No retractions. Gastrointestinal: Soft and nontender. No  distention. Musculoskeletal: No obvious deformities Neurologic:  Normal speech and language. No gross focal neurologic deficits are appreciated. Skin: Approximately 1 cm vertical linear laceration through the distal tip of the left thumb approximately midway into the nailbed and sparing the eponychium.  Skin is warm and dry. No rash noted. Psychiatric: Mood and affect are normal. Speech and behavior are normal.  ____________________________________________   LABS (all labs ordered are listed, but only abnormal results are displayed)  Labs Reviewed - No data to display ____________________________________________ RADIOLOGY  ED MD interpretation: 2 view x-ray of the left thumb does not show any evidence of underlying fracture or foreign body but does show a soft tissue laceration at the distal thumb  Official radiology report(s): DG Finger Thumb Left  Result Date: 02/11/2021 CLINICAL DATA:  Left thumb laceration EXAM: LEFT THUMB 2+V COMPARISON:  None. FINDINGS: There is no evidence of fracture or dislocation. There is no evidence of arthropathy or other focal bone abnormality. Small soft tissue defect at the distal thumb compatible with reported laceration. No radiopaque foreign body. IMPRESSION: Soft tissue laceration of the distal thumb without underlying fracture or radiopaque foreign body. Electronically Signed   By: Davina Poke D.O.   On: 02/11/2021 14:01    ____________________________________________   PROCEDURES  Procedure(s) performed (including Critical Care):  Marland KitchenMarland KitchenLaceration Repair  Date/Time: 02/11/2021 4:33 PM Performed by: Naaman Plummer, MD Authorized by: Naaman Plummer, MD   Consent:    Consent obtained:  Verbal   Consent given by:  Patient   Risks, benefits, and alternatives were discussed: yes     Risks discussed:  Infection, need for additional repair, poor cosmetic result and poor wound healing   Alternatives discussed:  No treatment Universal protocol:     Immediately prior to procedure, a time out was called: yes     Patient identity confirmed:  Verbally with patient Anesthesia:    Anesthesia method:  Local infiltration   Local anesthetic:  Lidocaine 1% w/o epi Laceration details:    Location:  Finger   Finger location:  L thumb   Length (cm):  1   Depth (mm):  3 Pre-procedure details:    Preparation:  Patient was prepped and draped in usual sterile fashion and imaging obtained to evaluate for foreign bodies Exploration:    Limited defect created (wound extended): no   Treatment:    Area cleansed with:  Povidone-iodine and saline   Amount of cleaning:  Standard   Irrigation solution:  Sterile saline   Irrigation volume:  200   Irrigation method:  Pressure wash and syringe   Visualized foreign bodies/material removed: no     Debridement:  None   Undermining:  None   Scar revision: no   Skin repair:    Repair method:  Sutures   Suture size:  4-0   Suture material:  Nylon   Suture technique:  Simple interrupted   Number of sutures:  3 Approximation:    Approximation:  Close Repair type:    Repair type:  Simple Post-procedure details:  Dressing:  Non-adherent dressing   Procedure completion:  Tolerated well, no immediate complications   ____________________________________________   INITIAL IMPRESSION / ASSESSMENT AND PLAN / ED COURSE  As part of my medical decision making, I reviewed the following data within the electronic medical record, if available:  Nursing notes reviewed and incorporated, Labs reviewed, EKG interpreted, Old chart reviewed, Radiograph reviewed and Notes from prior ED visits reviewed and incorporated        Patient had a simple laceration that was repaired in the ED after copious irrigation.  Please see laceration procedure note for further details.  After exploration of the wound, there was no evidence of a retained foreign body. No evidence of underlying fracture. TDAP: UTD Interventions:  Defer ABX at this time given location, event time, and patient without surrounding signs of infection. Disposition: Discharge. Patient has been given strict wound return precautions and instructions to follow up with their PMD in 2 days for a wound recheck.      ____________________________________________   FINAL CLINICAL IMPRESSION(S) / ED DIAGNOSES  Final diagnoses:  Laceration of left thumb without foreign body with damage to nail, initial encounter     ED Discharge Orders     None        Note:  This document was prepared using Dragon voice recognition software and may include unintentional dictation errors.    Naaman Plummer, MD 02/11/21 [id number]

## 2021-02-11 NOTE — ED Triage Notes (Signed)
Pt comes pov with lac to left thumb from kitchen knife. Down fingernail and knuckle. Bleeding controlled at this time. UC wanted pt to come here due to it being through her nail.

## 2021-03-16 ENCOUNTER — Ambulatory Visit: Payer: 59 | Admitting: Family Medicine

## 2021-03-16 ENCOUNTER — Encounter: Payer: Self-pay | Admitting: Family Medicine

## 2021-03-16 ENCOUNTER — Other Ambulatory Visit: Payer: Self-pay

## 2021-03-16 VITALS — BP 98/68 | HR 84 | Temp 98.2°F | Ht 64.0 in | Wt 205.0 lb

## 2021-03-16 DIAGNOSIS — Z23 Encounter for immunization: Secondary | ICD-10-CM

## 2021-03-16 DIAGNOSIS — Z1211 Encounter for screening for malignant neoplasm of colon: Secondary | ICD-10-CM | POA: Diagnosis not present

## 2021-03-16 DIAGNOSIS — Z1159 Encounter for screening for other viral diseases: Secondary | ICD-10-CM

## 2021-03-16 DIAGNOSIS — Z124 Encounter for screening for malignant neoplasm of cervix: Secondary | ICD-10-CM | POA: Diagnosis not present

## 2021-03-16 DIAGNOSIS — E559 Vitamin D deficiency, unspecified: Secondary | ICD-10-CM

## 2021-03-16 DIAGNOSIS — R5383 Other fatigue: Secondary | ICD-10-CM | POA: Insufficient documentation

## 2021-03-16 DIAGNOSIS — Z13220 Encounter for screening for lipoid disorders: Secondary | ICD-10-CM

## 2021-03-16 DIAGNOSIS — D229 Melanocytic nevi, unspecified: Secondary | ICD-10-CM

## 2021-03-16 DIAGNOSIS — Z0000 Encounter for general adult medical examination without abnormal findings: Secondary | ICD-10-CM | POA: Insufficient documentation

## 2021-03-16 DIAGNOSIS — G43801 Other migraine, not intractable, with status migrainosus: Secondary | ICD-10-CM | POA: Diagnosis not present

## 2021-03-16 DIAGNOSIS — R69 Illness, unspecified: Secondary | ICD-10-CM | POA: Diagnosis not present

## 2021-03-16 DIAGNOSIS — Z114 Encounter for screening for human immunodeficiency virus [HIV]: Secondary | ICD-10-CM

## 2021-03-16 NOTE — Patient Instructions (Addendum)
-   Obtain fasting labs with orders provided (can have water or black coffee but otherwise no food or drink x 8 hours before labs) - Review information provided - Attend eye doctor annually, dentist every 6 months, work towards or maintain 30 minutes of moderate intensity physical activity at least 5 days per week, and consume a balanced diet - Return in 3 months for follow-up - Contact us for any questions between now and then

## 2021-03-16 NOTE — Progress Notes (Signed)
I did    Annual Physical Exam Visit  Patient Information:  Patient ID: Alison Logan, female DOB: [birth year] Age: 45 y.o. MRN: [id number]   Subjective:   CC: Annual Physical Exam  HPI:  Alison Logan is here for their annual physical.  I reviewed the past medical history, family history, social history, surgical history, and allergies today and changes were made as necessary.  Please see the problem list section below for additional details.  Past Medical History: Past Medical History:  Diagnosis Date   Allergy    Migraines    Ovarian cyst    Thyroid disease    Past Surgical History: Past Surgical History:  Procedure Laterality Date   LAPAROSCOPY  1997   NASAL SINUS SURGERY  2018   RHINOPLASTY  2018   THYROIDECTOMY  2012   WISDOM TOOTH EXTRACTION Bilateral 1996   Family History: Family History  Problem Relation Age of Onset   Uterine cancer Mother    Pancreatic cancer Father    Celiac disease Son    Stomach cancer Maternal Aunt    Pancreatic cancer Maternal Uncle    Uterine cancer Maternal Grandmother    Colon cancer Paternal Grandfather    Allergies: Allergies  Allergen Reactions   Aspartame Other (See Comments)    Headaches   Tree Extract Anaphylaxis    Tree nuts   Health Maintenance: Health Maintenance  Topic Date Due   Pneumococcal Vaccine 69-52 Years old (1 - PCV) Never done   Hepatitis C Screening  Never done   PAP SMEAR-Modifier  Never done   COVID-19 Vaccine (2 - Booster for Janssen series) 02/19/2020   COLONOSCOPY (Pts 45-35yrs Insurance coverage will need to be confirmed)  Never done   INFLUENZA VACCINE  08/07/2021 (Originally 12/08/2020)   TETANUS/TDAP  03/17/2031   HIV Screening  Completed   HPV VACCINES  Aged Out    HM Colonoscopy          Overdue - COLONOSCOPY (Pts 45-62yrs Insurance coverage will need to be confirmed) (Every 10 Years) Overdue - never done    No completion history exists for this topic.            Medications: Current Outpatient Medications on File Prior to Visit  Medication Sig Dispense Refill   albuterol (VENTOLIN HFA) 108 (90 Base) MCG/ACT inhaler Inhale 2 puffs into the lungs every 6 (six) hours as needed for wheezing or shortness of breath. 8 g 2   EPINEPHrine 0.3 mg/0.3 mL IJ SOAJ injection Inject 0.3 mg into the muscle once as needed.     liothyronine (CYTOMEL) 5 MCG tablet Take 5 mcg by mouth daily.     UBRELVY 100 MG TABS Take 100 mg by mouth daily as needed.     levothyroxine (SYNTHROID) 88 MCG tablet Take 88 mcg by mouth daily.     No current facility-administered medications on file prior to visit.    Review of Systems: +chronic migraine headaches, no visual changes, nausea, vomiting, diarrhea, constipation, dizziness, abdominal pain, +skin rash, fevers, chills, night sweats, swollen lymph nodes, weight loss, chest pain, body aches, joint swelling, muscle aches, shortness of breath, mood changes, visual or auditory hallucinations reported.  Objective:   Vitals:   03/16/21 1340  BP: 98/68  Pulse: 84  Temp: 98.2 F (36.8 C)  SpO2: 96%   Vitals:   03/16/21 1340  Weight: 205 lb (93 kg)  Height: 5\' 4"  (1.626 m)   Body mass index is 35.19 kg/m.  General:  Well Developed, well nourished, and in no acute distress.  Neuro: Alert and oriented x3, extra-ocular muscles intact, sensation grossly intact. Cranial nerves II through XII are grossly intact, motor, sensory, and coordinative functions are intact. HEENT: Normocephalic, atraumatic, pupils equal round reactive to light, neck supple, no masses, no lymphadenopathy, thyroid nonpalpable. Oropharynx, nasopharynx, external ear canals are unremarkable. Skin: Warm and dry, no rashes noted.  Multiple scattered overall benign appearing nevi noted on torso and extremities Cardiac: Regular rate and rhythm, no murmurs rubs or gallops. No peripheral edema. Pulses symmetric. Respiratory: Clear to auscultation bilaterally. Not  using accessory muscles, speaking in full sentences.  Abdominal: Soft, nontender, nondistended, positive bowel sounds, no masses, no organomegaly. Musculoskeletal: Shoulder, elbow, wrist, hip, knee, ankle stable, and with full range of motion.  Female chaperone initials: BN present throughout the physical examination.  Impression and Recommendations:   The patient was counselled, risk factors were discussed, and anticipatory guidance given.  Migraine Chronic issue that she has neurology for in the past, interested in establishing with local group. A referral was placed.  Annual physical exam Annual examination completed, risk stratification labs ordered, anticipatory guidance provided.  We will follow labs once resulted. TDAP administered.  Multiple nevi States that she has noted occurrence of multiple new nevi diffusely, is interested in further evaluation by dermatology. A referral was placed today.   Orders & Medications Medications: No orders of the defined types were placed in this encounter.  Orders Placed This Encounter  Procedures   Tdap vaccine greater than or equal to 7yo IM   TSH Rfx on Abnormal to Free T4   Lipid panel   CBC   VITAMIN D 25 Hydroxy (Vit-D Deficiency, Fractures)   Hepatitis C antibody   HIV Antibody (routine testing w rflx)   Ambulatory referral to Gastroenterology   Ambulatory referral to Neurology   Ambulatory referral to Gynecology   Ambulatory referral to Dermatology     Return in about 3 months (around 06/16/2021).    Montel Culver, MD   [facility] [place]

## 2021-03-16 NOTE — Assessment & Plan Note (Addendum)
Annual examination completed, risk stratification labs ordered, anticipatory guidance provided.  We will follow labs once resulted. TDAP administered. Referrals for screening colonoscopy, cervical and breast cancer screening placed.

## 2021-03-16 NOTE — Assessment & Plan Note (Signed)
States that she has noted occurrence of multiple new nevi diffusely, is interested in further evaluation by dermatology. A referral was placed today.

## 2021-03-16 NOTE — Assessment & Plan Note (Signed)
Chronic issue that she has neurology for in the past, interested in establishing with local group. A referral was placed.

## 2021-03-17 DIAGNOSIS — Z1159 Encounter for screening for other viral diseases: Secondary | ICD-10-CM | POA: Diagnosis not present

## 2021-03-17 DIAGNOSIS — Z13220 Encounter for screening for lipoid disorders: Secondary | ICD-10-CM | POA: Diagnosis not present

## 2021-03-17 DIAGNOSIS — Z114 Encounter for screening for human immunodeficiency virus [HIV]: Secondary | ICD-10-CM | POA: Diagnosis not present

## 2021-03-17 DIAGNOSIS — Z0000 Encounter for general adult medical examination without abnormal findings: Secondary | ICD-10-CM | POA: Diagnosis not present

## 2021-03-17 DIAGNOSIS — R69 Illness, unspecified: Secondary | ICD-10-CM | POA: Diagnosis not present

## 2021-03-17 DIAGNOSIS — E559 Vitamin D deficiency, unspecified: Secondary | ICD-10-CM | POA: Diagnosis not present

## 2021-03-18 ENCOUNTER — Other Ambulatory Visit: Payer: Self-pay | Admitting: Family Medicine

## 2021-03-18 DIAGNOSIS — E785 Hyperlipidemia, unspecified: Secondary | ICD-10-CM

## 2021-03-18 LAB — CBC
Hematocrit: 46.1 % (ref 34.0–46.6)
Hemoglobin: 15.2 g/dL (ref 11.1–15.9)
MCH: 31.5 pg (ref 26.6–33.0)
MCHC: 33 g/dL (ref 31.5–35.7)
MCV: 95 fL (ref 79–97)
Platelets: 337 10*3/uL (ref 150–450)
RBC: 4.83 x10E6/uL (ref 3.77–5.28)
RDW: 12.2 % (ref 11.7–15.4)
WBC: 9.4 10*3/uL (ref 3.4–10.8)

## 2021-03-18 LAB — TSH RFX ON ABNORMAL TO FREE T4: TSH: 0.664 u[IU]/mL (ref 0.450–4.500)

## 2021-03-18 LAB — LIPID PANEL
Chol/HDL Ratio: 4.4 ratio (ref 0.0–4.4)
Cholesterol, Total: 167 mg/dL (ref 100–199)
HDL: 38 mg/dL — ABNORMAL LOW (ref >39)
LDL Chol Calc (NIH): 112 mg/dL — ABNORMAL HIGH (ref 0–99)
Triglycerides: 91 mg/dL (ref 0–149)
VLDL Cholesterol Cal: 17 mg/dL (ref 5–40)

## 2021-03-18 LAB — VITAMIN D 25 HYDROXY (VIT D DEFICIENCY, FRACTURES): Vit D, 25-Hydroxy: 32.1 ng/mL (ref 30.0–100.0)

## 2021-03-18 LAB — HEPATITIS C ANTIBODY: Hep C Virus Ab: <0.1 s/co ratio (ref 0.0–0.9)

## 2021-03-18 LAB — HIV ANTIBODY (ROUTINE TESTING W REFLEX): HIV Screen 4th Generation wRfx: NONREACTIVE

## 2021-03-26 ENCOUNTER — Other Ambulatory Visit: Payer: Self-pay

## 2021-03-26 DIAGNOSIS — Z1211 Encounter for screening for malignant neoplasm of colon: Secondary | ICD-10-CM

## 2021-03-26 MED ORDER — NA SULFATE-K SULFATE-MG SULF 17.5-3.13-1.6 GM/177ML PO SOLN: 1.0000 | Freq: Once | ORAL | 0 refills | Status: AC

## 2021-03-26 NOTE — Progress Notes (Signed)
Gastroenterology Pre-Procedure Review  Request Date: 06/05/2021 Requesting Physician: Dr. Allen Norris  PATIENT REVIEW QUESTIONS: The patient responded to the following health history questions as indicated:    1. Are you having any GI issues? no 2. Do you have a personal history of Polyps? no 3. Do you have a family history of Colon Cancer or Polyps? no 4. Diabetes Mellitus? no 5. Joint replacements in the past 12 months?no 6. Major health problems in the past 3 months?no 7. Any artificial heart valves, MVP, or defibrillator?no    MEDICATIONS & ALLERGIES:    Patient reports the following regarding taking any anticoagulation/antiplatelet therapy:   Plavix, Coumadin, Eliquis, Xarelto, Lovenox, Pradaxa, Brilinta, or Effient? no Aspirin? no  Patient confirms/reports the following medications:  Current Outpatient Medications  Medication Sig Dispense Refill   Na Sulfate-K Sulfate-Mg Sulf 17.5-3.13-1.6 GM/177ML SOLN Take 1 kit by mouth once for 1 dose. 354 mL 0   albuterol (VENTOLIN HFA) 108 (90 Base) MCG/ACT inhaler Inhale 2 puffs into the lungs every 6 (six) hours as needed for wheezing or shortness of breath. 8 g 2   EPINEPHrine 0.3 mg/0.3 mL IJ SOAJ injection Inject 0.3 mg into the muscle once as needed.     levothyroxine (SYNTHROID) 88 MCG tablet Take 88 mcg by mouth daily.     liothyronine (CYTOMEL) 5 MCG tablet Take 5 mcg by mouth daily.     UBRELVY 100 MG TABS Take 100 mg by mouth daily as needed.     No current facility-administered medications for this visit.    Patient confirms/reports the following allergies:  Allergies  Allergen Reactions   Aspartame Other (See Comments)    Headaches   Tree Extract Anaphylaxis    Tree nuts    Orders Placed This Encounter  Procedures   Procedural/ Surgical Case Request: COLONOSCOPY WITH PROPOFOL    Standing Status:   Standing    Number of Occurrences:   1    Order Specific Question:   Pre-op diagnosis    Answer:   Colon cancer screening  Z12.11    Order Specific Question:   CPT Code    Answer:   87867    AUTHORIZATION INFORMATION Primary Insurance: 1D#: Group #:  Secondary Insurance: 1D#: Group #:  SCHEDULE INFORMATION: Date: 06/05/2021 Time: Location: [place]

## 2021-03-26 NOTE — Progress Notes (Deleted)
Gastroenterology Pre-Procedure Review  Request Date: 06/05/2021 Requesting Physician: Dr. Allen Norris  PATIENT REVIEW QUESTIONS: The patient responded to the following health history questions as indicated:    1. Are you having any GI issues? no 2. Do you have a personal history of Polyps? no 3. Do you have a family history of Colon Cancer or Polyps? Yes, Parental grandfather 4. Diabetes Mellitus? no 5. Joint replacements in the past 12 months?no 6. Major health problems in the past 3 months?no 7. Any artificial heart valves, MVP, or defibrillator?no    MEDICATIONS & ALLERGIES:    Patient reports the following regarding taking any anticoagulation/antiplatelet therapy:   Plavix, Coumadin, Eliquis, Xarelto, Lovenox, Pradaxa, Brilinta, or Effient? no Aspirin? no  Patient confirms/reports the following medications:  Current Outpatient Medications  Medication Sig Dispense Refill   albuterol (VENTOLIN HFA) 108 (90 Base) MCG/ACT inhaler Inhale 2 puffs into the lungs every 6 (six) hours as needed for wheezing or shortness of breath. 8 g 2   EPINEPHrine 0.3 mg/0.3 mL IJ SOAJ injection Inject 0.3 mg into the muscle once as needed.     levothyroxine (SYNTHROID) 88 MCG tablet Take 88 mcg by mouth daily.     liothyronine (CYTOMEL) 5 MCG tablet Take 5 mcg by mouth daily.     UBRELVY 100 MG TABS Take 100 mg by mouth daily as needed.     No current facility-administered medications for this visit.    Patient confirms/reports the following allergies:  Allergies  Allergen Reactions   Aspartame Other (See Comments)    Headaches   Tree Extract Anaphylaxis    Tree nuts    No orders of the defined types were placed in this encounter.   AUTHORIZATION INFORMATION Primary Insurance: 1D#: Group #:  Secondary Insurance: 1D#: Group #:  SCHEDULE INFORMATION: Date: 06/05/21 Time: Location: [place]

## 2021-04-22 ENCOUNTER — Other Ambulatory Visit (HOSPITAL_COMMUNITY)
Admission: RE | Admit: 2021-04-22 | Discharge: 2021-04-22 | Disposition: A | Discharge status: Home / Self Care | Payer: 59 | Source: Ambulatory Visit | Attending: Advanced Practice Midwife | Admitting: Advanced Practice Midwife

## 2021-04-22 ENCOUNTER — Encounter: Payer: Self-pay | Admitting: Advanced Practice Midwife

## 2021-04-22 ENCOUNTER — Ambulatory Visit (INDEPENDENT_AMBULATORY_CARE_PROVIDER_SITE_OTHER): Payer: 59 | Admitting: Advanced Practice Midwife

## 2021-04-22 ENCOUNTER — Other Ambulatory Visit: Payer: Self-pay

## 2021-04-22 VITALS — BP 122/86 | HR 76 | Ht 64.0 in | Wt 204.4 lb

## 2021-04-22 DIAGNOSIS — Z124 Encounter for screening for malignant neoplasm of cervix: Secondary | ICD-10-CM | POA: Insufficient documentation

## 2021-04-22 DIAGNOSIS — N924 Excessive bleeding in the premenopausal period: Secondary | ICD-10-CM

## 2021-04-22 NOTE — Progress Notes (Signed)
Patient ID: Alison Logan, female   DOB: May 20, 1975, 45 y.o.   MRN: [id number]  Reason for Consult: No chief complaint on file.   Referred by Montel Culver, MD  Subjective:  HPI:  Alison Logan is a 45 y.o. female referred for PAP smear/cervical cancer screening. Her last PAP was 4 years ago and was normal. She has questions regarding menopause. Her cycles for the past 6 months have been irregular and longer and heavier. They occur every 2-6 weeks and last 5 days.  She changes a pad every hour the first 2 days. She mentions clots and cramping as well. She has no known history of fibroids. She requests hormone and ultrasound evaluation. She uses vasectomy for birth control and has no concerns for STDs.   Past Medical History:  Diagnosis Date   Allergy    Migraines    Ovarian cyst    Thyroid disease    Family History  Problem Relation Age of Onset   Uterine cancer Mother    Pancreatic cancer Father    Celiac disease Son    Stomach cancer Maternal Aunt    Pancreatic cancer Maternal Uncle    Uterine cancer Maternal Grandmother    Colon cancer Paternal Grandfather    Past Surgical History:  Procedure Laterality Date   LAPAROSCOPY  1997   NASAL SINUS SURGERY  2018   RHINOPLASTY  2018   THYROIDECTOMY  2012   WISDOM TOOTH EXTRACTION Bilateral 1996    Short Social History:  Social History   Tobacco Use   Smoking status: Every Day    Packs/day: 0.50    Years: 8.00    Pack years: 4.00    Types: Cigarettes   Smokeless tobacco: Never  Substance Use Topics   Alcohol use: Yes    Alcohol/week: 2.0 standard drinks    Types: 2 Standard drinks or equivalent per week    Allergies  Allergen Reactions   Aspartame Other (See Comments)    Headaches   Tree Extract Anaphylaxis    Tree nuts    Current Outpatient Medications  Medication Sig Dispense Refill   albuterol (VENTOLIN HFA) 108 (90 Base) MCG/ACT inhaler Inhale 2 puffs into the lungs every 6 (six) hours as  needed for wheezing or shortness of breath. 8 g 2   EPINEPHrine 0.3 mg/0.3 mL IJ SOAJ injection Inject 0.3 mg into the muscle once as needed.     levothyroxine (SYNTHROID) 88 MCG tablet Take 88 mcg by mouth daily.     UBRELVY 100 MG TABS Take 100 mg by mouth daily as needed.     liothyronine (CYTOMEL) 5 MCG tablet Take 5 mcg by mouth daily.     No current facility-administered medications for this visit.    Review of Systems  Constitutional:  Negative for chills and fever.  HENT:  Negative for congestion, ear discharge, ear pain, hearing loss, sinus pain and sore throat.   Eyes:  Negative for blurred vision and double vision.  Respiratory:  Negative for cough, shortness of breath and wheezing.   Cardiovascular:  Negative for chest pain, palpitations and leg swelling.  Gastrointestinal:  Negative for abdominal pain, blood in stool, constipation, diarrhea, heartburn, melena, nausea and vomiting.  Genitourinary:  Negative for dysuria, flank pain, frequency, hematuria and urgency.  Musculoskeletal:  Negative for back pain, joint pain and myalgias.  Skin:  Negative for itching and rash.  Neurological:  Negative for dizziness, tingling, tremors, sensory change, speech change, focal weakness, seizures, loss  of consciousness, weakness and headaches.  Endo/Heme/Allergies:  Negative for environmental allergies. Does not bruise/bleed easily.       Positive for heavy painful periods with clots  Psychiatric/Behavioral:  Negative for depression, hallucinations, memory loss, substance abuse and suicidal ideas. The patient is not nervous/anxious and does not have insomnia.        Objective:  Objective   Vitals:   04/22/21 1328  BP: 122/86  Pulse: 76  Weight: 204 lb 6.4 oz (92.7 kg)  Height: 5\' 4"  (1.626 m)   Body mass index is 35.09 kg/m. Constitutional: Well nourished, well developed female in no acute distress.  HEENT: normal Skin: Warm and dry.  Cardiovascular: Regular rate and rhythm.    Extremity:  no edema   Respiratory: Clear to auscultation bilateral. Normal respiratory effort Neuro: DTRs 2+, Cranial nerves grossly intact Psych: Alert and Oriented x3. No memory deficits. Normal mood and affect.  MS: normal gait, normal bilateral lower extremity ROM/strength/stability.  Pelvic exam:  is not limited by body habitus EGBUS: within normal limits Vagina: within normal limits and with normal mucosa  Cervix: grossly normal appearance   Assessment/Plan:     45 y.o. female with menorrhagia, needing cervical cancer screening  PAP smear Gyn ultrasound with f/u after Estradiol Progesterone FSH/LH Follow up as needed after labs result   [facility] 04/22/2021, 2:36 PM

## 2021-04-23 LAB — PROGESTERONE: Progesterone: 0.2 ng/mL

## 2021-04-23 LAB — ESTRADIOL: Estradiol: 112 pg/mL

## 2021-04-23 LAB — FSH/LH
FSH: 6.8 m[IU]/mL
LH: 6.9 m[IU]/mL

## 2021-04-27 ENCOUNTER — Encounter: Payer: Self-pay | Admitting: Advanced Practice Midwife

## 2021-04-27 LAB — CYTOLOGY - PAP
Comment: NEGATIVE
Diagnosis: NEGATIVE
Diagnosis: REACTIVE
High risk HPV: NEGATIVE

## 2021-04-27 NOTE — Telephone Encounter (Signed)
Appointment has been cancelled.

## 2021-04-27 NOTE — Telephone Encounter (Signed)
Please cancel appt

## 2021-04-28 ENCOUNTER — Ambulatory Visit: Payer: 59

## 2021-04-30 ENCOUNTER — Ambulatory Visit: Payer: 59

## 2021-05-14 ENCOUNTER — Ambulatory Visit: Payer: 59 | Admitting: Advanced Practice Midwife

## 2021-05-20 ENCOUNTER — Other Ambulatory Visit: Payer: Self-pay

## 2021-05-20 NOTE — Progress Notes (Signed)
Patient has requested to reschedule procedure to 06/19/2021 per [place]. Instructions will be updated in chart.

## 2021-06-02 ENCOUNTER — Encounter: Payer: Self-pay | Admitting: Family Medicine

## 2021-06-02 ENCOUNTER — Other Ambulatory Visit: Payer: Self-pay

## 2021-06-02 ENCOUNTER — Ambulatory Visit: Payer: 59 | Admitting: Family Medicine

## 2021-06-02 VITALS — BP 106/72 | HR 72 | Temp 98.3°F | Ht 64.0 in | Wt 206.0 lb

## 2021-06-02 DIAGNOSIS — R0981 Nasal congestion: Secondary | ICD-10-CM | POA: Diagnosis not present

## 2021-06-02 DIAGNOSIS — J329 Chronic sinusitis, unspecified: Secondary | ICD-10-CM | POA: Insufficient documentation

## 2021-06-02 DIAGNOSIS — J019 Acute sinusitis, unspecified: Secondary | ICD-10-CM | POA: Diagnosis not present

## 2021-06-02 DIAGNOSIS — B9689 Other specified bacterial agents as the cause of diseases classified elsewhere: Secondary | ICD-10-CM | POA: Diagnosis not present

## 2021-06-02 LAB — POCT INFLUENZA A/B
Influenza A, POC: NEGATIVE
Influenza B, POC: NEGATIVE

## 2021-06-02 MED ORDER — FLUNISOLIDE 25 MCG/ACT (0.025%) NA SOLN: 2.0000 | Freq: Two times a day (BID) | NASAL | 0 refills | Status: DC

## 2021-06-02 MED ORDER — AMOXICILLIN 875 MG PO TABS: 875.0000 mg | ORAL_TABLET | Freq: Two times a day (BID) | ORAL | 0 refills | Status: AC

## 2021-06-02 MED ORDER — PROMETHAZINE-DM 6.25-15 MG/5ML PO SYRP: 5.0000 mL | ORAL_SOLUTION | Freq: Four times a day (QID) | ORAL | 0 refills | Status: DC | PRN

## 2021-06-02 NOTE — Assessment & Plan Note (Signed)
Patient with 3 day history of progressive sinus congestion, ear pain, and recent shortness of air. She has had cough productive of yellowish thick sputum and difficulty sleeping due to nighttime cough.  She denies any fevers or sick contacts, she took a COVID test at home yesterday which was negative.  Examination reveals tender sinuses bilateral in the frontal, ethmoid, maxillary distributions, right tympanic membrane and canal mildly erythematous, contralateral benign, swollen and erythematous nasal turbinates, oropharynx benign, no significant cervical lymphadenopathy, clear lung fields bilaterally without wheezes, rales, rhonchi, benign cardiac findings.  We performed a point-of-care influenza test which was negative, given her stated history, findings today, concern for acute bacterial rhinosinusitis.  Plan for amoxicillin regimen, Rx intranasal steroid, Rx cough medicine to be used as needed, and supportive care.  If symptoms persist at the 10-day mark or beyond, she was advised to contact our office for next steps and further evaluation.

## 2021-06-02 NOTE — Progress Notes (Signed)
°  ° °  Primary Care / Sports Medicine Office Visit  Patient Information:  Patient ID: Alison Logan, female DOB: Feb 06, 1976 Age: 46 y.o. MRN: [id number]   Alison Logan is a pleasant 46 y.o. female presenting with the following:  Chief Complaint  Patient presents with   Sinus Problem   Nasal Congestion   Cough   Otalgia    Vitals:   06/02/21 0915  BP: 106/72  Pulse: 72  Temp: 98.3 F (36.8 C)  SpO2: 98%   Vitals:   06/02/21 0915  Weight: 206 lb (93.4 kg)  Height: 5\' 4"  (1.626 m)   Body mass index is 35.36 kg/m.  No results found.   Independent interpretation of notes and tests performed by another provider:   None  Procedures performed:   None  Pertinent History, Exam, Impression, and Recommendations:   Acute bacterial rhinosinusitis Patient with 3 day history of progressive sinus congestion, ear pain, and recent shortness of air. She has had cough productive of yellowish thick sputum and difficulty sleeping due to nighttime cough.  She denies any fevers or sick contacts, she took a COVID test at home yesterday which was negative.  Examination reveals tender sinuses bilateral in the frontal, ethmoid, maxillary distributions, right tympanic membrane and canal mildly erythematous, contralateral benign, swollen and erythematous nasal turbinates, oropharynx benign, no significant cervical lymphadenopathy, clear lung fields bilaterally without wheezes, rales, rhonchi, benign cardiac findings.  We performed a point-of-care influenza test which was negative, given her stated history, findings today, concern for acute bacterial rhinosinusitis.  Plan for amoxicillin regimen, Rx intranasal steroid, Rx cough medicine to be used as needed, and supportive care.  If symptoms persist at the 10-day mark or beyond, she was advised to contact our office for next steps and further evaluation.   Orders & Medications Meds ordered this encounter  Medications   amoxicillin  (AMOXIL) 875 MG tablet    Sig: Take 1 tablet (875 mg total) by mouth 2 (two) times daily for 10 days.    Dispense:  20 tablet    Refill:  0   flunisolide (NASALIDE) 25 MCG/ACT (0.025%) SOLN    Sig: Place 2 sprays into the nose 2 (two) times daily.    Dispense:  25 mL    Refill:  0   promethazine-dextromethorphan (PROMETHAZINE-DM) 6.25-15 MG/5ML syrup    Sig: Take 5 mLs by mouth 4 (four) times daily as needed for cough.    Dispense:  118 mL    Refill:  0   Orders Placed This Encounter  Procedures   POCT Influenza A/B     Return if symptoms worsen or fail to improve.     Montel Culver, MD   Primary Care Sports [facility] [place]

## 2021-06-02 NOTE — Patient Instructions (Signed)
-   Start antibiotics, take for full 10-day course - Use steroid nasal spray, 2 sprays in each nostril daily x10 days - If unable to pick up Rx, pick up Flonase and use instead - Can use cough syrup on an as-needed basis - Get plenty of rest and stay adequately hydrated - Contact us for any lingering symptoms or lack of adequate progress at the 10-day mark or beyond  - Follow-up as needed

## 2021-06-04 ENCOUNTER — Encounter: Payer: Self-pay | Admitting: Gastroenterology

## 2021-06-05 ENCOUNTER — Telehealth: Payer: Self-pay

## 2021-06-05 ENCOUNTER — Other Ambulatory Visit: Payer: Self-pay | Admitting: Family Medicine

## 2021-06-05 DIAGNOSIS — B9689 Other specified bacterial agents as the cause of diseases classified elsewhere: Secondary | ICD-10-CM

## 2021-06-05 MED ORDER — BENZONATATE 200 MG PO CAPS: 200.0000 mg | ORAL_CAPSULE | Freq: Three times a day (TID) | ORAL | 0 refills | Status: DC | PRN

## 2021-06-05 NOTE — Telephone Encounter (Signed)
Spoke with patient and advised her of her appointment on 06/16/21 and need for repeat labs, which have been ordered.  Per patient will drop by Monday or Tuesday to complete the labs.  Requisitions placed in basket at front.  Saw patient for an acute visit 06/02/21 and is feeling better overall, but cannot sleep due to constant cough.  Is there anything else that can be prescribed to help with this?  Patient taking amoxicillin, Nasalide, and promethazine cough syrup as directed.

## 2021-06-08 NOTE — Telephone Encounter (Signed)
Patient notified and verbalized understanding.  No further questions at this time.

## 2021-06-16 ENCOUNTER — Ambulatory Visit: Payer: 59 | Admitting: Family Medicine

## 2021-06-24 ENCOUNTER — Other Ambulatory Visit: Payer: Self-pay | Admitting: Family Medicine

## 2021-06-24 DIAGNOSIS — B9689 Other specified bacterial agents as the cause of diseases classified elsewhere: Secondary | ICD-10-CM

## 2021-06-24 DIAGNOSIS — J019 Acute sinusitis, unspecified: Secondary | ICD-10-CM

## 2021-06-24 NOTE — Telephone Encounter (Signed)
Requested medications are due for refill today.  unsure  Requested medications are on the active medications list.  yes  Last refill. 06/02/2021 55mL 0 refills  Future visit scheduled.   no  Notes to clinic.  Medication not delegated.    Requested Prescriptions  Pending Prescriptions Disp Refills   flunisolide (NASALIDE) 25 MCG/ACT (0.025%) SOLN [Pharmacy Med Name: FLUNISOLIDE 0.025% SPRAY]      Sig: Place 2 sprays into the nose 2 (two) times daily.     Not Delegated - Ear, Nose, and Throat: Nasal Preparations - Corticosteroids Failed - 06/24/2021  1:32 PM      Failed - This refill cannot be delegated      Passed - Valid encounter within last 12 months    Recent Outpatient Visits           3 weeks ago Acute bacterial rhinosinusitis   [facility] Montel Culver, MD   3 months ago Annual physical exam   [facility] Montel Culver, MD

## 2021-06-26 ENCOUNTER — Encounter: Payer: Self-pay | Admitting: Gastroenterology

## 2021-06-26 ENCOUNTER — Other Ambulatory Visit: Payer: Self-pay

## 2021-06-26 ENCOUNTER — Ambulatory Visit
Admission: RE | Disposition: A | Discharge status: Home / Self Care | Payer: Self-pay | Source: Ambulatory Visit | Attending: Gastroenterology

## 2021-06-26 ENCOUNTER — Ambulatory Visit: Payer: 59 | Admitting: Anesthesiology

## 2021-06-26 ENCOUNTER — Ambulatory Visit
Admission: RE | Admit: 2021-06-26 | Discharge: 2021-06-26 | Disposition: A | Discharge status: Home / Self Care | Payer: 59 | Source: Ambulatory Visit | Attending: Gastroenterology | Admitting: Gastroenterology

## 2021-06-26 DIAGNOSIS — K640 First degree hemorrhoids: Secondary | ICD-10-CM | POA: Diagnosis not present

## 2021-06-26 DIAGNOSIS — Z79890 Hormone replacement therapy (postmenopausal): Secondary | ICD-10-CM | POA: Diagnosis not present

## 2021-06-26 DIAGNOSIS — Z1211 Encounter for screening for malignant neoplasm of colon: Secondary | ICD-10-CM | POA: Diagnosis not present

## 2021-06-26 DIAGNOSIS — J45909 Unspecified asthma, uncomplicated: Secondary | ICD-10-CM | POA: Diagnosis not present

## 2021-06-26 DIAGNOSIS — D126 Benign neoplasm of colon, unspecified: Secondary | ICD-10-CM | POA: Diagnosis not present

## 2021-06-26 DIAGNOSIS — E039 Hypothyroidism, unspecified: Secondary | ICD-10-CM | POA: Diagnosis not present

## 2021-06-26 DIAGNOSIS — D122 Benign neoplasm of ascending colon: Secondary | ICD-10-CM | POA: Diagnosis not present

## 2021-06-26 DIAGNOSIS — K635 Polyp of colon: Secondary | ICD-10-CM | POA: Diagnosis not present

## 2021-06-26 DIAGNOSIS — G4730 Sleep apnea, unspecified: Secondary | ICD-10-CM | POA: Insufficient documentation

## 2021-06-26 DIAGNOSIS — Z79899 Other long term (current) drug therapy: Secondary | ICD-10-CM | POA: Insufficient documentation

## 2021-06-26 DIAGNOSIS — D125 Benign neoplasm of sigmoid colon: Secondary | ICD-10-CM | POA: Diagnosis not present

## 2021-06-26 DIAGNOSIS — Z87891 Personal history of nicotine dependence: Secondary | ICD-10-CM | POA: Diagnosis not present

## 2021-06-26 HISTORY — DX: Sleep apnea, unspecified: G47.30

## 2021-06-26 HISTORY — PX: POLYPECTOMY: SHX5525

## 2021-06-26 HISTORY — PX: COLONOSCOPY WITH PROPOFOL: SHX5780

## 2021-06-26 HISTORY — DX: Unspecified asthma, uncomplicated: J45.909

## 2021-06-26 LAB — POCT PREGNANCY, URINE: Preg Test, Ur: NEGATIVE

## 2021-06-26 SURGERY — COLONOSCOPY WITH PROPOFOL
Anesthesia: General | Site: Rectum

## 2021-06-26 MED ORDER — PROPOFOL 10 MG/ML IV BOLUS
INTRAVENOUS | Status: DC | PRN
Start: 2021-06-26 — End: 2021-06-26
  Administered 2021-06-26: 30 mg via INTRAVENOUS
  Administered 2021-06-26: 50 mg via INTRAVENOUS
  Administered 2021-06-26 (×2): 30 mg via INTRAVENOUS
  Administered 2021-06-26: 70 mg via INTRAVENOUS

## 2021-06-26 MED ORDER — SODIUM CHLORIDE 0.9 % IV SOLN: INTRAVENOUS | Status: DC

## 2021-06-26 MED ORDER — LACTATED RINGERS IV SOLN: INTRAVENOUS | Status: DC

## 2021-06-26 MED ORDER — STERILE WATER FOR IRRIGATION IR SOLN
Status: DC | PRN
  Administered 2021-06-26: 1

## 2021-06-26 MED ORDER — LIDOCAINE HCL (CARDIAC) PF 100 MG/5ML IV SOSY
PREFILLED_SYRINGE | INTRAVENOUS | Status: DC | PRN
  Administered 2021-06-26: 50 mg via INTRAVENOUS

## 2021-06-26 SURGICAL SUPPLY — 8 items
GOWN CVR UNV OPN BCK APRN NK (MISCELLANEOUS) ×2 IMPLANT
GOWN ISOL THUMB LOOP REG UNIV (MISCELLANEOUS) ×4
KIT PRC NS LF DISP ENDO (KITS) ×1 IMPLANT
KIT PROCEDURE OLYMPUS (KITS) ×2
MANIFOLD NEPTUNE II (INSTRUMENTS) ×2 IMPLANT
SNARE COLD EXACTO (MISCELLANEOUS) ×1 IMPLANT
TRAP ETRAP POLY (MISCELLANEOUS) ×1 IMPLANT
WATER STERILE IRR 250ML POUR (IV SOLUTION) ×2 IMPLANT

## 2021-06-26 NOTE — H&P (Signed)
Lucilla Lame, MD [facility] [address], [place], [place] [address] Phone: [phone] Fax : [phone]  Primary Care Physician:  Montel Culver, MD Primary Gastroenterologist:  Dr. Allen Norris  Pre-Procedure History & Physical: HPI:  Alison Logan is a 46 y.o. female is here for a screening colonoscopy.   Past Medical History:  Diagnosis Date   Allergy    Asthma    Migraines    Ovarian cyst    Sleep apnea    no CPAP   Thyroid disease     Past Surgical History:  Procedure Laterality Date   LAPAROSCOPY  1997   NASAL SINUS SURGERY  2018   RHINOPLASTY  2018   THYROIDECTOMY  2012   WISDOM TOOTH EXTRACTION Bilateral 1996    Prior to Admission medications   Medication Sig Start Date End Date Taking? Authorizing Provider  albuterol (VENTOLIN HFA) 108 (90 Base) MCG/ACT inhaler Inhale 2 puffs into the lungs every 6 (six) hours as needed for wheezing or shortness of breath. 10/15/20  Yes Rodriguez-Southworth, Sunday Spillers, PA-C  Ascorbic Acid (VITAMIN C) 1000 MG tablet Take 1,000 mg by mouth daily.   Yes [provider]  benzonatate (TESSALON) 200 MG capsule Take 1 capsule (200 mg total) by mouth 3 (three) times daily as needed for cough. 06/05/21  Yes Montel Culver, MD  EPINEPHrine 0.3 mg/0.3 mL IJ SOAJ injection Inject 0.3 mg into the muscle once as needed.   Yes [provider]  flunisolide (NASALIDE) 25 MCG/ACT (0.025%) SOLN Place 2 sprays into the nose 2 (two) times daily. 06/02/21  Yes Montel Culver, MD  levothyroxine (SYNTHROID) 88 MCG tablet Take 88 mcg by mouth daily. 03/03/21  Yes [provider]  Multiple Vitamin (MULTIVITAMIN) tablet Take 1 tablet by mouth daily.   Yes [provider]  promethazine-dextromethorphan (PROMETHAZINE-DM) 6.25-15 MG/5ML syrup Take 5 mLs by mouth 4 (four) times daily as needed for cough. 06/02/21  Yes Montel Culver, MD  UBRELVY 100 MG TABS Take 100 mg by mouth daily as needed. 05/27/19  Yes [provider]  liothyronine (CYTOMEL) 5 MCG tablet Take 5 mcg by mouth daily. 02/08/18 06/02/21  [provider]    Allergies as of 03/26/2021 - Review Complete 03/16/2021  Allergen Reaction Noted   Aspartame Other (See Comments) 01/05/2017   Tree extract Anaphylaxis 03/17/2017    Family History  Problem Relation Age of Onset   Uterine cancer Mother    Pancreatic cancer Father    Celiac disease Son    Stomach cancer Maternal Aunt    Pancreatic cancer Maternal Uncle    Uterine cancer Maternal Grandmother    Colon cancer Paternal Grandfather     Social History   Socioeconomic History   Marital status: Married    Spouse name: Beonca Gibb   Number of children: 3   Years of education: 12+   Highest education level: Some college, no degree  Occupational History   Not on file  Tobacco Use   Smoking status: Former    Packs/day: 0.50    Years: 8.00    Pack years: 4.00    Types: Cigarettes    Quit date: 05/2021    Years since quitting: 0.1   Smokeless tobacco: Never  Vaping Use   Vaping Use: Never used  Substance and Sexual Activity   Alcohol use: Yes    Alcohol/week: 2.0 standard drinks    Types: 2 Standard drinks or equivalent per week    Comment:  socially   Drug use: Never   Sexual activity: Yes    Partners: Male  Other Topics Concern   Not on file  Social History Narrative   Not on file   Social Determinants of Health   Financial Resource Strain: Not on file  Food Insecurity: Not on file  Transportation Needs: Not on file  Physical Activity: Not on file  Stress: Not on file  Social Connections: Not on file  Intimate Partner Violence: Not on file    Review of Systems: See HPI, otherwise negative ROS  Physical Exam: BP 117/66    Pulse 69    Temp (!) 97.5 F (36.4 C)    Resp 20    Ht 5\' 4"  (1.626 m)    Wt 93.4 kg    LMP 05/14/2021    SpO2 97%    BMI 35.36 kg/m  General:   Alert,  pleasant and cooperative in NAD Head:  Normocephalic and  atraumatic. Neck:  Supple; no masses or thyromegaly. Lungs:  Clear throughout to auscultation.    Heart:  Regular rate and rhythm. Abdomen:  Soft, nontender and nondistended. Normal bowel sounds, without guarding, and without rebound.   Neurologic:  Alert and  oriented x4;  grossly normal neurologically.  Impression/Plan: Alison Logan is now here to undergo a screening colonoscopy.  Risks, benefits, and alternatives regarding colonoscopy have been reviewed with the patient.  Questions have been answered.  All parties agreeable.

## 2021-06-26 NOTE — Anesthesia Procedure Notes (Signed)
Date/Time: 06/26/2021 10:14 AM Performed by: Dionne Bucy, CRNA Pre-anesthesia Checklist: Patient identified, Emergency Drugs available, Suction available, Patient being monitored and Timeout performed Patient Re-evaluated:Patient Re-evaluated prior to induction Oxygen Delivery Method: Nasal cannula Induction Type: IV induction Placement Confirmation: positive ETCO2

## 2021-06-26 NOTE — Op Note (Signed)
[facility] Gastroenterology Patient Name: Alison Logan Procedure Date: 06/26/2021 10:11 AM MRN: [id number] Account #: [id number] Date of Birth: Aug 08, 1975 Admit Type: Outpatient Age: 46 Room: [facility] OR ROOM 01 Gender: Female Note Status: Finalized Instrument Name: [id number] Procedure:             Colonoscopy Indications:           Screening for colorectal malignant neoplasm Providers:             Lucilla Lame MD, MD Referring MD:          Rosette Reveal MD Medicines:             Propofol per Anesthesia Complications:         No immediate complications. Procedure:             Pre-Anesthesia Assessment:                        - Prior to the procedure, a History and Physical was                         performed, and patient medications and allergies were                         reviewed. The patient's tolerance of previous                         anesthesia was also reviewed. The risks and benefits                         of the procedure and the sedation options and risks                         were discussed with the patient. All questions were                         answered, and informed consent was obtained. Prior                         Anticoagulants: The patient has taken no previous                         anticoagulant or antiplatelet agents. ASA Grade                         Assessment: II - A patient with mild systemic disease.                         After reviewing the risks and benefits, the patient                         was deemed in satisfactory condition to undergo the                         procedure.                        After obtaining informed consent, the colonoscope was  passed under direct vision. Throughout the procedure,                         the patient's blood pressure, pulse, and oxygen                         saturations were monitored continuously. The was                         introduced through  the anus and advanced to the the                         cecum, identified by appendiceal orifice and ileocecal                         valve. The colonoscopy was performed without                         difficulty. The patient tolerated the procedure well.                         The quality of the bowel preparation was excellent. Findings:      The perianal and digital rectal examinations were normal.      A 3 mm polyp was found in the ascending colon. The polyp was sessile.       The polyp was removed with a cold snare. Resection and retrieval were       complete.      A 3 mm polyp was found in the sigmoid colon. The polyp was sessile. The       polyp was removed with a cold snare. Resection and retrieval were       complete.      Non-bleeding internal hemorrhoids were found during retroflexion. The       hemorrhoids were Grade I (internal hemorrhoids that do not prolapse). Impression:            - One 3 mm polyp in the ascending colon, removed with                         a cold snare. Resected and retrieved.                        - One 3 mm polyp in the sigmoid colon, removed with a                         cold snare. Resected and retrieved.                        - Non-bleeding internal hemorrhoids. Recommendation:        - Discharge patient to home.                        - Resume previous diet.                        - Continue present medications.                        - Await pathology results.                        -  If the pathology report reveals adenomatous tissue,                         then repeat the colonoscopy for surveillance in 7                         years. Procedure Code(s):     --- Professional ---                        [phone], Colonoscopy, flexible; with removal of                         tumor(s), polyp(s), or other lesion(s) by snare                         technique Diagnosis Code(s):     --- Professional ---                        Z12.11, Encounter  for screening for malignant neoplasm                         of colon                        K63.5, Polyp of colon CPT copyright 2019 American Medical Association. All rights reserved. The codes documented in this report are preliminary and upon coder review may  be revised to meet current compliance requirements. Lucilla Lame MD, MD 06/26/2021 10:34:04 AM This report has been signed electronically. Number of Addenda: 0 Note Initiated On: 06/26/2021 10:11 AM Scope Withdrawal Time: 0 hours 8 minutes 13 seconds  Total Procedure Duration: 0 hours 11 minutes 3 seconds  Estimated Blood Loss:  Estimated blood loss: none.      [facility]

## 2021-06-26 NOTE — Anesthesia Preprocedure Evaluation (Signed)
Anesthesia Evaluation  Patient identified by MRN, date of birth, ID band Patient awake    Reviewed: Allergy & Precautions, H&P , NPO status , Patient's Chart, lab work & pertinent test results, reviewed documented beta blocker date and time   Airway Mallampati: II  TM Distance: >3 FB Neck ROM: full    Dental no notable dental hx.    Pulmonary neg pulmonary ROS, asthma , sleep apnea , former smoker,    Pulmonary exam normal breath sounds clear to auscultation       Cardiovascular Exercise Tolerance: Good negative cardio ROS   Rhythm:regular Rate:Normal     Neuro/Psych  Headaches, negative neurological ROS  negative psych ROS   GI/Hepatic negative GI ROS, Neg liver ROS,   Endo/Other  negative endocrine ROSHypothyroidism   Renal/GU negative Renal ROS  negative genitourinary   Musculoskeletal   Abdominal   Peds  Hematology negative hematology ROS (+)   Anesthesia Other Findings   Reproductive/Obstetrics negative OB ROS                             Anesthesia Physical Anesthesia Plan  ASA: 2  Anesthesia Plan: General   Post-op Pain Management:    Induction:   PONV Risk Score and Plan: 2 and Propofol infusion, TIVA and Treatment may vary due to age or medical condition  Airway Management Planned:   Additional Equipment:   Intra-op Plan:   Post-operative Plan:   Informed Consent: I have reviewed the patients History and Physical, chart, labs and discussed the procedure including the risks, benefits and alternatives for the proposed anesthesia with the patient or authorized representative who has indicated his/her understanding and acceptance.     Dental Advisory Given  Plan Discussed with: CRNA  Anesthesia Plan Comments:         Anesthesia Quick Evaluation

## 2021-06-26 NOTE — Transfer of Care (Signed)
Immediate Anesthesia Transfer of Care Note  Patient: Alison Logan  Procedure(s) Performed: COLONOSCOPY WITH BIOPSY (Rectum) POLYPECTOMY (Rectum)  Patient Location: PACU  Anesthesia Type: General  Level of Consciousness: awake, alert  and patient cooperative  Airway and Oxygen Therapy: Patient Spontanous Breathing and Patient connected to supplemental oxygen  Post-op Assessment: Post-op Vital signs reviewed, Patient's Cardiovascular Status Stable, Respiratory Function Stable, Patent Airway and No signs of Nausea or vomiting  Post-op Vital Signs: Reviewed and stable  Complications: No notable events documented.

## 2021-06-26 NOTE — Anesthesia Postprocedure Evaluation (Signed)
Anesthesia Post Note  Patient: Alison Logan  Procedure(s) Performed: COLONOSCOPY WITH BIOPSY (Rectum) POLYPECTOMY (Rectum)     Patient location during evaluation: PACU Anesthesia Type: General Level of consciousness: awake and alert Pain management: pain level controlled Vital Signs Assessment: post-procedure vital signs reviewed and stable Respiratory status: spontaneous breathing, nonlabored ventilation and respiratory function stable Cardiovascular status: blood pressure returned to baseline and stable Postop Assessment: no apparent nausea or vomiting Anesthetic complications: no   No notable events documented.  April Manson

## 2021-06-29 ENCOUNTER — Encounter: Payer: Self-pay | Admitting: Gastroenterology

## 2021-06-30 ENCOUNTER — Encounter: Payer: Self-pay | Admitting: Gastroenterology

## 2021-06-30 LAB — SURGICAL PATHOLOGY

## 2021-07-13 ENCOUNTER — Telehealth: Payer: Self-pay

## 2021-07-13 NOTE — Telephone Encounter (Signed)
Noted  

## 2021-07-13 NOTE — Telephone Encounter (Signed)
Patient needs appointment with fasting labs a few days prior.  Cancelled appointment on 06/16/21.  Will leave lab requisitions at the front for patient to complete. ?

## 2021-08-02 ENCOUNTER — Other Ambulatory Visit: Payer: Self-pay | Admitting: Family Medicine

## 2021-08-02 DIAGNOSIS — B9689 Other specified bacterial agents as the cause of diseases classified elsewhere: Secondary | ICD-10-CM

## 2021-08-04 NOTE — Telephone Encounter (Signed)
Requested medication (s) are due for refill today: Yes ? ?Requested medication (s) are on the active medication list: Yes ? ?Last refill:  06/02/21 ? ?Future visit scheduled: No ? ?Notes to clinic:  See request. ? ? ? ?Requested Prescriptions  ?Pending Prescriptions Disp Refills  ? flunisolide (NASALIDE) 25 MCG/ACT (0.025%) SOLN [Pharmacy Med Name: FLUNISOLIDE 0.025% SPRAY]    ?  Sig: Place 2 sprays into the nose 2 (two) times daily.  ?  ? Not Delegated - Ear, Nose, and Throat: Nasal Preparations - Corticosteroids Failed - 08/02/2021 10:25 AM  ?  ?  Failed - This refill cannot be delegated  ?  ?  Passed - Valid encounter within last 12 months  ?  Recent Outpatient Visits   ? ?      ? 2 months ago Acute bacterial rhinosinusitis  ? [facility] Montel Culver, MD  ? 4 months ago Annual physical exam  ? [facility] Montel Culver, MD  ? ?  ?  ? ?  ?  ?  ? ?

## 2021-08-05 DIAGNOSIS — E039 Hypothyroidism, unspecified: Secondary | ICD-10-CM | POA: Diagnosis not present

## 2021-08-07 DIAGNOSIS — E039 Hypothyroidism, unspecified: Secondary | ICD-10-CM | POA: Diagnosis not present

## 2021-08-10 DIAGNOSIS — R69 Illness, unspecified: Secondary | ICD-10-CM | POA: Diagnosis not present

## 2021-08-10 DIAGNOSIS — Z7689 Persons encountering health services in other specified circumstances: Secondary | ICD-10-CM | POA: Diagnosis not present

## 2021-08-10 DIAGNOSIS — H53149 Visual discomfort, unspecified: Secondary | ICD-10-CM | POA: Diagnosis not present

## 2021-08-10 DIAGNOSIS — G43109 Migraine with aura, not intractable, without status migrainosus: Secondary | ICD-10-CM | POA: Diagnosis not present

## 2021-08-27 ENCOUNTER — Ambulatory Visit: Payer: 59 | Admitting: Family Medicine

## 2021-08-27 ENCOUNTER — Encounter: Payer: Self-pay | Admitting: Family Medicine

## 2021-08-27 ENCOUNTER — Ambulatory Visit
Admission: RE | Admit: 2021-08-27 | Discharge: 2021-08-27 | Disposition: A | Discharge status: Home / Self Care | Payer: 59 | Attending: Family Medicine | Admitting: Family Medicine

## 2021-08-27 ENCOUNTER — Ambulatory Visit
Admission: RE | Admit: 2021-08-27 | Discharge: 2021-08-27 | Disposition: A | Discharge status: Home / Self Care | Payer: 59 | Source: Ambulatory Visit | Attending: Family Medicine | Admitting: Family Medicine

## 2021-08-27 VITALS — BP 118/78 | HR 80 | Temp 98.6°F | Ht 64.0 in | Wt 207.0 lb

## 2021-08-27 DIAGNOSIS — J310 Chronic rhinitis: Secondary | ICD-10-CM

## 2021-08-27 DIAGNOSIS — J329 Chronic sinusitis, unspecified: Secondary | ICD-10-CM

## 2021-08-27 DIAGNOSIS — R053 Chronic cough: Secondary | ICD-10-CM

## 2021-08-27 MED ORDER — METHYLPREDNISOLONE 4 MG PO TBPK: ORAL_TABLET | ORAL | 0 refills | Status: DC

## 2021-08-27 MED ORDER — AZITHROMYCIN 250 MG PO TABS: ORAL_TABLET | ORAL | 0 refills | Status: AC

## 2021-08-27 NOTE — Assessment & Plan Note (Addendum)
Patient presents with recurrent left facial pressure, pain, cough, congestion, ear pain. Of note, she also describes chronic cough since discontinuation of smoking, this is a mildly productive cough.  Examination reveals sinus tenderness at the left maxillary region, shotty right-sided cervical lymphadenopathy, oropharynx benign, right TM with mild erythema, canal benign bilaterally, left TM benign.  Nasopharynx with mild erythema, lung fields are clear bilaterally. ? ?Given her stated symptomatology concern for recurrent sinusitis, viral etiology of primary concern, can possibly represent bacterial etiology.  This was conveyed to the patient and I have advised her to start prednisone, obtain chest x-ray for the chronic cough, utilize OTC Mucinex scheduled, and to refrain from antibiotics unless symptoms persist without improvement x4 days. ? ?We will follow-up on x-ray results once obtained, for recalcitrant/recurrent symptoms, ENT referral advised. ?

## 2021-08-27 NOTE — Patient Instructions (Signed)
-   Obtain chest x-ray today ?- Take steroid Dosepak for full course ?- Take Mucinex twice daily x10 days then as needed ?- If still symptomatic by Sunday, start antibiotics and take for full course ?- Contact us for any lingering symptoms despite the above ?- Return for annual physical 03/2022 timeframe ?

## 2021-08-27 NOTE — Progress Notes (Signed)
?  ? ?  Primary Care / Sports Medicine Office Visit ? ?Patient Information:  ?Patient ID: Alison Logan, female DOB: Dec 03, 1975 Age: 46 y.o. MRN: [id number]  ? ?[name] is a pleasant 46 y.o. female presenting with the following: ? ?Chief Complaint  ?Patient presents with  ? Cough  ?  For 3 days, took at home Covid was negative. Face and chest congestion started last night. No fever. Phlegm is yellow from coughing. States she is wheezing and used inhaler with no relief.   ? ? ?Vitals:  ? 08/27/21 0916  ?BP: 118/78  ?Pulse: 80  ?Temp: 98.6 ?F (37 ?C)  ?SpO2: 97%  ? ?Vitals:  ? 08/27/21 0916  ?Weight: 207 lb (93.9 kg)  ?Height: '5\' 4"'$  (1.626 m)  ? ?Body mass index is 35.53 kg/m?. ? ?No results found.  ? ?Independent interpretation of notes and tests performed by another provider:  ? ?None ? ?Procedures performed:  ? ?None ? ?Pertinent History, Exam, Impression, and Recommendations:  ? ?Problem List Items Addressed This Visit   ? ?  ? Respiratory  ? Rhinosinusitis - Primary  ?  Patient presents with recurrent left facial pressure, pain, cough, congestion, ear pain. Of note, she also describes chronic cough since discontinuation of smoking, this is a mildly productive cough.  Examination reveals sinus tenderness at the left maxillary region, shotty right-sided cervical lymphadenopathy, oropharynx benign, right TM with mild erythema, canal benign bilaterally, left TM benign.  Nasopharynx with mild erythema, lung fields are clear bilaterally. ? ?Given her stated symptomatology concern for recurrent sinusitis, viral etiology of primary concern, can possibly represent bacterial etiology.  This was conveyed to the patient and I have advised her to start prednisone, obtain chest x-ray for the chronic cough, utilize OTC Mucinex scheduled, and to refrain from antibiotics unless symptoms persist without improvement x4 days. ? ?We will follow-up on x-ray results once obtained, for recalcitrant/recurrent symptoms, ENT  referral advised. ? ?  ?  ? Relevant Medications  ? methylPREDNISolone (MEDROL DOSEPAK) 4 MG TBPK tablet  ? azithromycin (ZITHROMAX) 250 MG tablet  ?  ? Other  ? Chronic cough  ? Relevant Orders  ? DG Chest 2 View  ?  ? ?Orders & Medications ?Meds ordered this encounter  ?Medications  ? methylPREDNISolone (MEDROL DOSEPAK) 4 MG TBPK tablet  ?  Sig: Take per box for full course  ?  Dispense:  1 each  ?  Refill:  0  ? azithromycin (ZITHROMAX) 250 MG tablet  ?  Sig: Take 2 tablets on day 1, then 1 tablet daily on days 2 through 5  ?  Dispense:  6 tablet  ?  Refill:  0  ? ?Orders Placed This Encounter  ?Procedures  ? DG Chest 2 View  ?  ? ?Return in about 31 weeks (around 04/01/2022) for Physical.  ?  ? ?Montel Culver, MD ? ? Primary Care Sports Medicine ?[facility] ?[place]  ? ?

## 2021-09-01 ENCOUNTER — Other Ambulatory Visit: Payer: Self-pay | Admitting: Family Medicine

## 2021-09-01 DIAGNOSIS — B9689 Other specified bacterial agents as the cause of diseases classified elsewhere: Secondary | ICD-10-CM

## 2021-09-02 NOTE — Telephone Encounter (Signed)
Requested medication (s) are due for refill today: yes ? ?Requested medication (s) are on the active medication list: yes   ? ?Last refill: 07/15/21  25 0 refills ? ?Future visit scheduled no ? ?Notes to clinic:Not delegated ? ?Requested Prescriptions  ?Pending Prescriptions Disp Refills  ? flunisolide (NASALIDE) 25 MCG/ACT (0.025%) SOLN [Pharmacy Med Name: FLUNISOLIDE 0.025% SPRAY]    ?  Sig: PLACE 2 SPRAYS INTO THE NOSE 2 (TWO) TIMES DAILY.  ?  ? Not Delegated - Ear, Nose, and Throat: Nasal Preparations - Corticosteroids Failed - 09/01/2021  8:30 AM  ?  ?  Failed - This refill cannot be delegated  ?  ?  Passed - Valid encounter within last 12 months  ?  Recent Outpatient Visits   ? ?      ? 6 days ago Rhinosinusitis  ? [facility] Montel Culver, MD  ? 3 months ago Acute bacterial rhinosinusitis  ? [facility] Montel Culver, MD  ? 5 months ago Annual physical exam  ? [facility] Montel Culver, MD  ? ?  ?  ? ? ?  ?  ?  ? ? ? ? ?

## 2021-10-07 ENCOUNTER — Other Ambulatory Visit: Payer: Self-pay | Admitting: Family Medicine

## 2021-10-07 DIAGNOSIS — B9689 Other specified bacterial agents as the cause of diseases classified elsewhere: Secondary | ICD-10-CM

## 2021-10-08 NOTE — Telephone Encounter (Signed)
Requested Prescriptions  Pending Prescriptions Disp Refills  . flunisolide (NASALIDE) 25 MCG/ACT (0.025%) SOLN [Pharmacy Med Name: FLUNISOLIDE 0.025% SPRAY] 25 mL 0    Sig: PLACE 2 SPRAYS INTO THE NOSE 2 (TWO) TIMES DAILY.     Ear, Nose, and Throat: Nasal Preparations - Corticosteroids Passed - 10/07/2021  9:36 AM      Passed - Valid encounter within last 12 months    Recent Outpatient Visits          1 month ago Rhinosinusitis   [facility] Montel Culver, MD   4 months ago Acute bacterial rhinosinusitis   [facility] Montel Culver, MD   6 months ago Annual physical exam   [facility] Montel Culver, MD

## 2021-10-22 ENCOUNTER — Other Ambulatory Visit: Payer: Self-pay | Admitting: Family Medicine

## 2021-10-22 DIAGNOSIS — B9689 Other specified bacterial agents as the cause of diseases classified elsewhere: Secondary | ICD-10-CM

## 2021-10-22 NOTE — Telephone Encounter (Signed)
   Notes to clinic:  Request per pharm Pharmacy comment: REQUEST FOR 90 DAYS PRESCRIPTION.      Requested Prescriptions  Pending Prescriptions Disp Refills   flunisolide (NASALIDE) 25 MCG/ACT (0.025%) SOLN [Pharmacy Med Name: FLUNISOLIDE 0.025% SPRAY]  1    Sig: PLACE 2 SPRAYS INTO THE NOSE 2 (TWO) TIMES DAILY.     Ear, Nose, and Throat: Nasal Preparations - Corticosteroids Passed - 10/22/2021  9:39 AM      Passed - Valid encounter within last 12 months    Recent Outpatient Visits           1 month ago Rhinosinusitis   [facility] Montel Culver, MD   4 months ago Acute bacterial rhinosinusitis   [facility] Montel Culver, MD   7 months ago Annual physical exam   [facility] Montel Culver, MD

## 2021-12-07 ENCOUNTER — Ambulatory Visit (INDEPENDENT_AMBULATORY_CARE_PROVIDER_SITE_OTHER): Payer: 59

## 2021-12-07 ENCOUNTER — Encounter: Payer: Self-pay | Admitting: Emergency Medicine

## 2021-12-07 ENCOUNTER — Ambulatory Visit
Admission: EM | Admit: 2021-12-07 | Discharge: 2021-12-07 | Disposition: A | Discharge status: Home / Self Care | Payer: 59 | Attending: Emergency Medicine | Admitting: Emergency Medicine

## 2021-12-07 DIAGNOSIS — M549 Dorsalgia, unspecified: Secondary | ICD-10-CM | POA: Diagnosis not present

## 2021-12-07 DIAGNOSIS — M5432 Sciatica, left side: Secondary | ICD-10-CM | POA: Diagnosis not present

## 2021-12-07 DIAGNOSIS — M5489 Other dorsalgia: Secondary | ICD-10-CM

## 2021-12-07 DIAGNOSIS — M545 Low back pain, unspecified: Secondary | ICD-10-CM | POA: Diagnosis not present

## 2021-12-07 DIAGNOSIS — G8929 Other chronic pain: Secondary | ICD-10-CM

## 2021-12-07 DIAGNOSIS — M546 Pain in thoracic spine: Secondary | ICD-10-CM | POA: Diagnosis not present

## 2021-12-07 MED ORDER — PREDNISONE 10 MG (21) PO TBPK: ORAL_TABLET | Freq: Every day | ORAL | 0 refills | Status: DC

## 2021-12-07 MED ORDER — METHOCARBAMOL 500 MG PO TABS: 500.0000 mg | ORAL_TABLET | Freq: Two times a day (BID) | ORAL | 0 refills | Status: DC

## 2021-12-07 NOTE — ED Triage Notes (Addendum)
Pt presents with mid back pain x 2 days. The pain radiates down her left leg and hurts to take a deep breath in. She reports standing on her feet all day 2 days ago denies any injury.

## 2021-12-07 NOTE — ED Provider Notes (Signed)
[facility]    CSN: [id number] Arrival date & time: 12/07/21  1006      History   Chief Complaint Chief Complaint  Patient presents with   Back Pain    Entered by patient    HPI Alison Logan is a 46 y.o. female.   Patient presents with lumbar pain Mid center  after standing at work that radiates down her left leg.  Patient states that she does have chronic back pain this seems to be more persistent than before.  Patient has not taken anything prior to arrival.  Denies any urinary symptoms.  No chest pain but does have some shortness of breath with taking in a deep breath intermittently.    Past Medical History:  Diagnosis Date   Allergy    Asthma    Migraines    Ovarian cyst    Sleep apnea    no CPAP   Thyroid disease     Patient Active Problem List   Diagnosis Date Noted   Chronic cough 08/27/2021   Colon cancer screening    Polyp of sigmoid colon    Rhinosinusitis 06/02/2021   Fatigue 03/16/2021   Morbid obesity ([place]) 03/16/2021   Annual physical exam 03/16/2021   Multiple nevi 03/16/2021   Neck pain 10/24/2019   Pain in thoracic spine 10/24/2019   DDD (degenerative disc disease), cervical 10/12/2019   Strain of neck muscle 10/12/2019   Postoperative hypothyroidism 03/03/2017   OSA (obstructive sleep apnea) 02/07/2017   Migraine 01/05/2017   Mild intermittent asthma without complication 08/65/7846   Tree nut allergy 01/05/2017   Allergic rhinitis 12/21/2016   Chronic pansinusitis 12/21/2016   Headache disorder 12/21/2016   Sleep-disordered breathing 12/21/2016    Past Surgical History:  Procedure Laterality Date   COLONOSCOPY WITH PROPOFOL N/A 06/26/2021   Procedure: COLONOSCOPY WITH BIOPSY;  Surgeon: Lucilla Lame, MD;  Location: [place];  Service: Endoscopy;  Laterality: N/A;   LAPAROSCOPY  1997   NASAL SINUS SURGERY  2018   POLYPECTOMY N/A 06/26/2021   Procedure: POLYPECTOMY;  Surgeon: Lucilla Lame, MD;  Location: [place];  Service: Endoscopy;  Laterality: N/A;   RHINOPLASTY  2018   THYROIDECTOMY  2012   WISDOM TOOTH EXTRACTION Bilateral 1996    OB History   No obstetric history on file.      Home Medications    Prior to Admission medications   Medication Sig Start Date End Date Taking? Authorizing Provider  levothyroxine (SYNTHROID) 88 MCG tablet Take 88 mcg by mouth daily. 03/03/21  Yes [provider]  liothyronine (CYTOMEL) 5 MCG tablet Take 5 mcg by mouth daily. 02/08/18 12/07/21 Yes [provider]  methocarbamol (ROBAXIN) 500 MG tablet Take 1 tablet (500 mg total) by mouth 2 (two) times daily. 12/07/21  Yes Marney Setting, NP  predniSONE (STERAPRED UNI-PAK 21 TAB) 10 MG (21) TBPK tablet Take by mouth daily. Take 6 tabs by mouth daily  for 2 days, then 5 tabs for 2 days, then 4 tabs for 2 days, then 3 tabs for 2 days, 2 tabs for 2 days, then 1 tab by mouth daily for 2 days 12/07/21  Yes Alroy Dust, Maurilio Puryear L, NP  UBRELVY 100 MG TABS Take 100 mg by mouth daily as needed. 05/27/19  Yes [provider]  albuterol (VENTOLIN HFA) 108 (90 Base) MCG/ACT inhaler Inhale 2 puffs into the lungs every 6 (six) hours as needed for wheezing or shortness of breath. 10/15/20  Rodriguez-Southworth, Sunday Spillers, PA-C  Ascorbic Acid (VITAMIN C) 1000 MG tablet Take 1,000 mg by mouth daily.    [provider]  EPINEPHrine 0.3 mg/0.3 mL IJ SOAJ injection Inject 0.3 mg into the muscle once as needed.    [provider]  flunisolide (NASALIDE) 25 MCG/ACT (0.025%) SOLN PLACE 2 SPRAYS INTO THE NOSE 2 (TWO) TIMES DAILY. 10/08/21   Montel Culver, MD  Multiple Vitamin (MULTIVITAMIN) tablet Take 1 tablet by mouth daily.    [provider]    Family History Family History  Problem Relation Age of Onset   Uterine cancer Mother    Pancreatic cancer Father    Celiac disease Son    Stomach cancer Maternal Aunt    Pancreatic cancer Maternal Uncle    Uterine cancer  Maternal Grandmother    Colon cancer Paternal Grandfather     Social History Social History   Tobacco Use   Smoking status: Former    Packs/day: 0.50    Years: 8.00    Total pack years: 4.00    Types: Cigarettes    Quit date: 05/2021    Years since quitting: 0.5   Smokeless tobacco: Never  Vaping Use   Vaping Use: Never used  Substance Use Topics   Alcohol use: Yes    Alcohol/week: 2.0 standard drinks of alcohol    Types: 2 Standard drinks or equivalent per week    Comment: socially   Drug use: Never     Allergies   Aspartame and Tree extract   Review of Systems Review of Systems  Constitutional:  Negative for fever.  Respiratory:  Positive for shortness of breath.   Cardiovascular: Negative.   Gastrointestinal: Negative.   Genitourinary: Negative.   Musculoskeletal:  Positive for back pain.       Lumbar pain mid back radiates down left leg  Skin: Negative.   Neurological: Negative.      Physical Exam Triage Vital Signs ED Triage Vitals  Enc Vitals Group     BP 12/07/21 1020 108/65     Pulse Rate 12/07/21 1020 66     Resp 12/07/21 1020 16     Temp 12/07/21 1020 98.2 F (36.8 C)     Temp Source 12/07/21 1020 Oral     SpO2 12/07/21 1020 99 %     Weight --      Height --      Head Circumference --      Peak Flow --      Pain Score 12/07/21 1018 8     Pain Loc --      Pain Edu? --      Excl. in [place]? --    No data found.  Updated Vital Signs BP 108/65 (BP Location: Left Arm)   Pulse 66   Temp 98.2 F (36.8 C) (Oral)   Resp 16   LMP 12/04/2021 Comment: denies preg, signed preg waiver  SpO2 99%   Visual Acuity Right Eye Distance:   Left Eye Distance:   Bilateral Distance:    Right Eye Near:   Left Eye Near:    Bilateral Near:     Physical Exam Constitutional:      Appearance: Normal appearance.  Cardiovascular:     Rate and Rhythm: Normal rate.  Pulmonary:     Effort: Pulmonary effort is normal.     Breath sounds: Normal breath  sounds.  Abdominal:     General: Abdomen is flat.  Musculoskeletal:  General: Tenderness present. No signs of injury.     Comments: Patient has decreased range of motion with flexion is able to sit and stand but does have discomfort going down left leg.  Strong pulses bilateral.  Neurological:     General: No focal deficit present.     Mental Status: She is alert.      UC Treatments / Results  Labs (all labs ordered are listed, but only abnormal results are displayed) Labs Reviewed - No data to display  EKG   Radiology DG Lumbar Spine Complete  Result Date: 12/07/2021 CLINICAL DATA:  Mid back pain for 2 days radiating down left leg. EXAM: LUMBAR SPINE - COMPLETE 4+ VIEW; THORACIC SPINE 2 VIEWS COMPARISON:  CT abdomen/pelvis 01/25/2018 FINDINGS: Thoracic: Vertebral body heights are preserved. Alignment is normal. The disc spaces are preserved. There is minimal degenerative endplate change in the midthoracic spine. The imaged heart and lungs are unremarkable. The soft tissues are unremarkable. Lumbar: There are 5 non-rib-bearing lumbar-type vertebral bodies. Vertebral body heights are preserved. Alignment is normal. There is no spondylolysis. The disc heights are preserved. There is minimal degenerative endplate change. There is mild facet arthropathy at L4-L5 and L5-S1. The soft tissues are unremarkable.  The SI joints are intact. IMPRESSION: 1. No evidence of acute injury in the thoracic or lumbar spine. 2. Minimal degenerative endplate change in the midthoracic spine. 3. Minimal degenerative endplate change in the lumbar spine and mild facet arthropathy at L4-L5 and L5-S1. Electronically Signed   By: Valetta Mole M.D.   On: 12/07/2021 11:23   DG Thoracic Spine 2 View  Result Date: 12/07/2021 CLINICAL DATA:  Mid back pain for 2 days radiating down left leg. EXAM: LUMBAR SPINE - COMPLETE 4+ VIEW; THORACIC SPINE 2 VIEWS COMPARISON:  CT abdomen/pelvis 01/25/2018 FINDINGS: Thoracic:  Vertebral body heights are preserved. Alignment is normal. The disc spaces are preserved. There is minimal degenerative endplate change in the midthoracic spine. The imaged heart and lungs are unremarkable. The soft tissues are unremarkable. Lumbar: There are 5 non-rib-bearing lumbar-type vertebral bodies. Vertebral body heights are preserved. Alignment is normal. There is no spondylolysis. The disc heights are preserved. There is minimal degenerative endplate change. There is mild facet arthropathy at L4-L5 and L5-S1. The soft tissues are unremarkable.  The SI joints are intact. IMPRESSION: 1. No evidence of acute injury in the thoracic or lumbar spine. 2. Minimal degenerative endplate change in the midthoracic spine. 3. Minimal degenerative endplate change in the lumbar spine and mild facet arthropathy at L4-L5 and L5-S1. Electronically Signed   By: Valetta Mole M.D.   On: 12/07/2021 11:23    Procedures Procedures (including critical care time)  Medications Ordered in UC Medications - No data to display  Initial Impression / Assessment and Plan / UC Course  I have reviewed the triage vital signs and the nursing notes.  Pertinent labs & imaging results that were available during my care of the patient were reviewed by me and considered in my medical decision making (see chart for details).     Patient is requesting to have an x-ray done while she is here.  Educated patient she will need to follow-up with [facility] for further testing and treatment of chronic back pain. The x ray does show some change. Sent patient an message in my chart of results Can use heat/motrin/tylenol as needed for pain  Do not take the robaxin while driving can make you sleepy  Final Clinical Impressions(s) / UC  Diagnoses   Final diagnoses:  Sciatica of left side  Other chronic back pain   Discharge Instructions   None    ED Prescriptions     Medication Sig Dispense Auth. Provider   methocarbamol (ROBAXIN)  500 MG tablet Take 1 tablet (500 mg total) by mouth 2 (two) times daily. 20 tablet Morley Kos L, NP   predniSONE (STERAPRED UNI-PAK 21 TAB) 10 MG (21) TBPK tablet Take by mouth daily. Take 6 tabs by mouth daily  for 2 days, then 5 tabs for 2 days, then 4 tabs for 2 days, then 3 tabs for 2 days, 2 tabs for 2 days, then 1 tab by mouth daily for 2 days 42 tablet Marney Setting, NP      PDMP not reviewed this encounter.   Marney Setting, NP 12/07/21 1343

## 2021-12-08 ENCOUNTER — Encounter: Payer: Self-pay | Admitting: Family Medicine

## 2021-12-08 ENCOUNTER — Ambulatory Visit: Payer: 59 | Admitting: Family Medicine

## 2021-12-08 VITALS — BP 120/76 | HR 72 | Ht 64.0 in | Wt 206.0 lb

## 2021-12-08 DIAGNOSIS — M47815 Spondylosis without myelopathy or radiculopathy, thoracolumbar region: Secondary | ICD-10-CM | POA: Diagnosis not present

## 2021-12-08 DIAGNOSIS — M4727 Other spondylosis with radiculopathy, lumbosacral region: Secondary | ICD-10-CM | POA: Diagnosis not present

## 2021-12-08 MED ORDER — TRAMADOL HCL 50 MG PO TABS: 50.0000 mg | ORAL_TABLET | Freq: Three times a day (TID) | ORAL | 0 refills | Status: DC | PRN

## 2021-12-08 MED ORDER — DICLOFENAC SODIUM 50 MG PO TBEC: 50.0000 mg | DELAYED_RELEASE_TABLET | Freq: Two times a day (BID) | ORAL | 0 refills | Status: DC

## 2021-12-08 MED ORDER — GABAPENTIN 100 MG PO CAPS: 100.0000 mg | ORAL_CAPSULE | Freq: Every day | ORAL | 0 refills | Status: DC

## 2021-12-08 NOTE — Assessment & Plan Note (Signed)
Patient presenting with acute on chronic thoracolumbar and lumbosacral pain with left greater than right symptoms, ongoing for years, last flareup was 6 months prior, most recently noted over the past week in the setting of increased activity (time on feet).  Has had symptoms radiating down the left leg, aggravated by prolonged weightbearing and prolonged sitting.  Examination reveals significantly decreased range of motion for his left deep hip/core musculature, positive straight leg raise on the left, positive Faber bilaterally, positive Kemps test throughout the thoracolumbar spine and diffuse paraspinal tenderness.  Given her x-rays of the thoracic and lumbar spine showing scattered degenerative changes with more involved facet hypertrophy at the lower lumbar/lumbosacral spine, have advised continued course of prednisone, initiation of diclofenac, gabapentin, and continued as needed Robaxin.  She can also use tramadol for breakthrough severe pain.

## 2021-12-08 NOTE — Assessment & Plan Note (Signed)
See additional assessment(s) for plan details. 

## 2021-12-08 NOTE — Progress Notes (Signed)
Primary Care / Sports Medicine Office Visit  Patient Information:  Patient ID: Alison Logan, female DOB: Aug 21, 1975 Age: 46 y.o. MRN: [id number]   Alison Logan is a pleasant 46 y.o. female presenting with the following:  Chief Complaint  Patient presents with   Back Pain    Pain in between shoulder blades since Saturday, radiates down left leg. Went to UC go Xrays yesterday    Vitals:   12/08/21 1603  BP: 120/76  Pulse: 72   Vitals:   12/08/21 1603  Weight: 206 lb (93.4 kg)  Height: '5\' 4"'$  (1.626 m)   Body mass index is 35.36 kg/m.  DG Lumbar Spine Complete  Result Date: 12/07/2021 CLINICAL DATA:  Mid back pain for 2 days radiating down left leg. EXAM: LUMBAR SPINE - COMPLETE 4+ VIEW; THORACIC SPINE 2 VIEWS COMPARISON:  CT abdomen/pelvis 01/25/2018 FINDINGS: Thoracic: Vertebral body heights are preserved. Alignment is normal. The disc spaces are preserved. There is minimal degenerative endplate change in the midthoracic spine. The imaged heart and lungs are unremarkable. The soft tissues are unremarkable. Lumbar: There are 5 non-rib-bearing lumbar-type vertebral bodies. Vertebral body heights are preserved. Alignment is normal. There is no spondylolysis. The disc heights are preserved. There is minimal degenerative endplate change. There is mild facet arthropathy at L4-L5 and L5-S1. The soft tissues are unremarkable.  The SI joints are intact. IMPRESSION: 1. No evidence of acute injury in the thoracic or lumbar spine. 2. Minimal degenerative endplate change in the midthoracic spine. 3. Minimal degenerative endplate change in the lumbar spine and mild facet arthropathy at L4-L5 and L5-S1. Electronically Signed   By: Valetta Mole M.D.   On: 12/07/2021 11:23   DG Thoracic Spine 2 View  Result Date: 12/07/2021 CLINICAL DATA:  Mid back pain for 2 days radiating down left leg. EXAM: LUMBAR SPINE - COMPLETE 4+ VIEW; THORACIC SPINE 2 VIEWS COMPARISON:  CT abdomen/pelvis 01/25/2018  FINDINGS: Thoracic: Vertebral body heights are preserved. Alignment is normal. The disc spaces are preserved. There is minimal degenerative endplate change in the midthoracic spine. The imaged heart and lungs are unremarkable. The soft tissues are unremarkable. Lumbar: There are 5 non-rib-bearing lumbar-type vertebral bodies. Vertebral body heights are preserved. Alignment is normal. There is no spondylolysis. The disc heights are preserved. There is minimal degenerative endplate change. There is mild facet arthropathy at L4-L5 and L5-S1. The soft tissues are unremarkable.  The SI joints are intact. IMPRESSION: 1. No evidence of acute injury in the thoracic or lumbar spine. 2. Minimal degenerative endplate change in the midthoracic spine. 3. Minimal degenerative endplate change in the lumbar spine and mild facet arthropathy at L4-L5 and L5-S1. Electronically Signed   By: Valetta Mole M.D.   On: 12/07/2021 11:23     Independent interpretation of notes and tests performed by another provider:   Independent interpretation of thoracic and lumbar spine x-rays dated 12/07/2021 reveals scattered degenerative changes without significant intervertebral narrowing, there is anterior endplate osteophyte formation noted throughout, facet hypertrophy most prominent at the L4-5 and L5-S1 levels with increasing sclerosis, no acute osseous processes identified  Procedures performed:   None  Pertinent History, Exam, Impression, and Recommendations:   Problem List Items Addressed This Visit       Nervous and Auditory   Lumbosacral spondylosis with radiculopathy - Primary    Patient presenting with acute on chronic thoracolumbar and lumbosacral pain with left greater than right symptoms, ongoing for years, last flareup was 6 months  prior, most recently noted over the past week in the setting of increased activity (time on feet).  Has had symptoms radiating down the left leg, aggravated by prolonged weightbearing and  prolonged sitting.  Examination reveals significantly decreased range of motion for his left deep hip/core musculature, positive straight leg raise on the left, positive Faber bilaterally, positive Kemps test throughout the thoracolumbar spine and diffuse paraspinal tenderness.  Given her x-rays of the thoracic and lumbar spine showing scattered degenerative changes with more involved facet hypertrophy at the lower lumbar/lumbosacral spine, have advised continued course of prednisone, initiation of diclofenac, gabapentin, and continued as needed Robaxin.  She can also use tramadol for breakthrough severe pain.      Relevant Medications   diclofenac (VOLTAREN) 50 MG EC tablet   gabapentin (NEURONTIN) 100 MG capsule   traMADol (ULTRAM) 50 MG tablet     Musculoskeletal and Integument   Spondylosis of thoracolumbar spine    See additional assessment(s) for plan details.      Relevant Medications   diclofenac (VOLTAREN) 50 MG EC tablet   traMADol (ULTRAM) 50 MG tablet     Orders & Medications Meds ordered this encounter  Medications   diclofenac (VOLTAREN) 50 MG EC tablet    Sig: Take 1 tablet (50 mg total) by mouth 2 (two) times daily.    Dispense:  60 tablet    Refill:  0   gabapentin (NEURONTIN) 100 MG capsule    Sig: Take 1 capsule (100 mg total) by mouth at bedtime.    Dispense:  45 capsule    Refill:  0   traMADol (ULTRAM) 50 MG tablet    Sig: Take 1 tablet (50 mg total) by mouth every 8 (eight) hours as needed for moderate pain. Maximum 6 tabs per day.    Dispense:  15 tablet    Refill:  0   No orders of the defined types were placed in this encounter.    Return in about 6 weeks (around 01/19/2022).     Alison Culver, MD   Primary Care Sports Medicine [place]

## 2021-12-08 NOTE — Patient Instructions (Addendum)
-   Finish out prednisone (steroid) - Start diclofenac (anti-inflammatory) twice daily x7 days then twice daily as needed - Start gabapentin (nerve medication) nightly - Can dose Robaxin (muscle relaxer) nightly on an as-needed basis - Can dose tramadol for severe pain not responding to the above - Start physical therapy, referral coordinator will contact you for scheduling - Return for follow-up in 6 weeks

## 2021-12-08 NOTE — Addendum Note (Signed)
Addended by: Montel Culver on: 12/08/2021 06:55 PM   Modules accepted: Orders

## 2022-01-04 ENCOUNTER — Other Ambulatory Visit: Payer: Self-pay | Admitting: Family Medicine

## 2022-01-04 DIAGNOSIS — M47815 Spondylosis without myelopathy or radiculopathy, thoracolumbar region: Secondary | ICD-10-CM

## 2022-01-04 DIAGNOSIS — M4727 Other spondylosis with radiculopathy, lumbosacral region: Secondary | ICD-10-CM

## 2022-01-04 NOTE — Telephone Encounter (Signed)
Requested Prescriptions  Pending Prescriptions Disp Refills  . diclofenac (VOLTAREN) 50 MG EC tablet [Pharmacy Med Name: DICLOFENAC SOD EC 50 MG TAB] 60 tablet 0    Sig: TAKE 1 TABLET BY MOUTH TWICE A DAY     Analgesics:  NSAIDS Failed - 01/04/2022  2:07 AM      Failed - Manual Review: Labs are only required if the patient has taken medication for more than 8 weeks.      Failed - Cr in normal range and within 360 days    Creatinine, Ser  Date Value Ref Range Status  03/30/2018 1.10 (H) 0.44 - 1.00 mg/dL Final         Failed - eGFR is 30 or above and within 360 days    GFR calc Af Amer  Date Value Ref Range Status  03/30/2018 >60 >60 mL/min Final    Comment:    (NOTE) The eGFR has been calculated using the CKD EPI equation. This calculation has not been validated in all clinical situations. eGFR's persistently <60 mL/min signify possible Chronic Kidney Disease.    GFR calc non Af Amer  Date Value Ref Range Status  03/30/2018 >60 >60 mL/min Final         Passed - HGB in normal range and within 360 days    Hemoglobin  Date Value Ref Range Status  03/17/2021 15.2 11.1 - 15.9 g/dL Final         Passed - PLT in normal range and within 360 days    Platelets  Date Value Ref Range Status  03/17/2021 337 150 - 450 x10E3/uL Final         Passed - HCT in normal range and within 360 days    Hematocrit  Date Value Ref Range Status  03/17/2021 46.1 34.0 - 46.6 % Final         Passed - Patient is not pregnant      Passed - Valid encounter within last 12 months    Recent Outpatient Visits          3 weeks ago Lumbosacral spondylosis with radiculopathy   [place] [facility] at [place], [place]   4 months ago Rhinosinusitis   [facility] at [place], [place]   7 months ago Acute bacterial rhinosinusitis   [place] [facility] at [facility], Earley Abide, MD   9 months ago Annual physical exam   [facility] at [facility], Earley Abide, MD      Future Appointments            In 2 weeks Zigmund Daniel, Earley Abide, MD [place] [facility] at [facility], [facility]

## 2022-01-08 ENCOUNTER — Other Ambulatory Visit: Payer: Self-pay | Admitting: Family Medicine

## 2022-01-08 DIAGNOSIS — M4727 Other spondylosis with radiculopathy, lumbosacral region: Secondary | ICD-10-CM

## 2022-01-12 NOTE — Telephone Encounter (Signed)
Requested Prescriptions  Pending Prescriptions Disp Refills  . gabapentin (NEURONTIN) 100 MG capsule [Pharmacy Med Name: GABAPENTIN 100 MG CAPSULE] 30 capsule 1    Sig: TAKE 1 CAPSULE BY MOUTH AT BEDTIME.     Neurology: Anticonvulsants - gabapentin Failed - 01/08/2022  2:26 PM      Failed - Cr in normal range and within 360 days    Creatinine, Ser  Date Value Ref Range Status  03/30/2018 1.10 (H) 0.44 - 1.00 mg/dL Final         Passed - Completed PHQ-2 or PHQ-9 in the last 360 days      Passed - Valid encounter within last 12 months    Recent Outpatient Visits          1 month ago Lumbosacral spondylosis with radiculopathy   [place] [facility] at [place], Earley Abide, MD   4 months ago Rhinosinusitis   [facility] at [place], Earley Abide, MD   7 months ago Acute bacterial rhinosinusitis   [place] [facility] at [facility], Earley Abide, MD   10 months ago Annual physical exam   [facility] Medicine at [facility], Earley Abide, MD      Future Appointments            In 1 week Zigmund Daniel, Earley Abide, MD [place] and Sports Medicine at [facility], [facility]

## 2022-01-15 ENCOUNTER — Telehealth: Payer: Self-pay | Admitting: Family Medicine

## 2022-01-15 ENCOUNTER — Encounter: Payer: Self-pay | Admitting: Family Medicine

## 2022-01-15 ENCOUNTER — Ambulatory Visit: Payer: Self-pay | Admitting: *Deleted

## 2022-01-15 ENCOUNTER — Telehealth (INDEPENDENT_AMBULATORY_CARE_PROVIDER_SITE_OTHER): Payer: 59 | Admitting: Family Medicine

## 2022-01-15 VITALS — Ht 64.0 in

## 2022-01-15 DIAGNOSIS — U071 COVID-19: Secondary | ICD-10-CM | POA: Diagnosis not present

## 2022-01-15 MED ORDER — METHYLPREDNISOLONE 4 MG PO TBPK: ORAL_TABLET | ORAL | 0 refills | Status: DC

## 2022-01-15 MED ORDER — MOLNUPIRAVIR EUA 200MG CAPSULE: 4.0000 | ORAL_CAPSULE | Freq: Two times a day (BID) | ORAL | 0 refills | Status: AC

## 2022-01-15 MED ORDER — PROMETHAZINE-DM 6.25-15 MG/5ML PO SYRP: 5.0000 mL | ORAL_SOLUTION | Freq: Four times a day (QID) | ORAL | 0 refills | Status: DC | PRN

## 2022-01-15 NOTE — Telephone Encounter (Signed)
Copied from [place] [phone]. Topic: General - Inquiry >> Jan 15, 2022 12:46 PM Marcellus Scott wrote: Reason for CRM: Pt stated she had an appointment this morning with Dr. Zigmund Daniel, and medication was supposed to be called in for her, but it hasn't. Pt is requesting an update. Mentioned wants her daughter to pick up medication for her today.   Please advise.

## 2022-01-15 NOTE — Telephone Encounter (Signed)
Duplicate  Pt sent Mychart message.  KP

## 2022-01-15 NOTE — Telephone Encounter (Signed)
  Chief Complaint: Slight SOB, congested Symptoms: husband has COVID, so far her test yesterday was negative Frequency: last 2 days Pertinent Negatives: Patient denies fever Disposition: '[]'$ ED /'[]'$ Urgent Care (no appt availability in office) / '[x]'$ Appointment(In office/virtual)/ '[]'$  [facility] [facility]/ '[]'$ Home Care/ '[]'$ Refused Recommended Disposition /'[]'$ [facility]/ '[]'$  Follow-up with PCP Additional Notes: Virtual appt this morning with Dr. Zigmund Daniel. Pt to test for COVID prior to visit, her husband is positive and she started symptoms now.   Reason for Disposition  [1] COVID-19 EXPOSURE within last 14 days AND [2] weak immune system (e.g., HIV positive, cancer chemo, splenectomy, organ transplant, chronic steroids) AND [3] NO symptoms  Answer Assessment - Initial Assessment Questions 1. COVID-19 EXPOSURE: "Please describe how you were exposed to someone with a COVID-19 infection."    By husband in same house 2. PLACE of CONTACT: "Where were you when you were exposed to COVID-19?" (e.g., home, school, medical waiting room; which city?)     home 3. TYPE of CONTACT: "How much contact was there?" (e.g., sitting next to, live in same house, work in same office, same building)     Same house 4. DURATION of CONTACT: "How long were you in contact with the COVID-19 patient?" (e.g., a few seconds, passed by person, a few minutes, 15 minutes or longer, live with the patient)     Several days 5. MASK: "Were you wearing a mask?" "Was the other person wearing a mask?" Note: wearing a mask reduces the risk of an otherwise close contact.     no 6. DATE of CONTACT: "When did you have contact with a COVID-19 patient?" (e.g., how many days ago)     A few days 7. COMMUNITY SPREAD: "Do you live in or have you traveled to an area where there are lots of COVID-19 cases (community spread)?" (See public health department website, if unsure)       na 8. SYMPTOMS: "Do you have any symptoms?" (e.g., fever,  cough, breathing difficulty, loss of taste or smell)     A little sob and sinus congestion 9. VACCINE: "Have you gotten the COVID-19 vaccine?" If Yes, ask: "Which one, how many shots, when did you get it?"     J&J at the beginning 10. PREGNANCY OR POSTPARTUM: "Is there any chance you are pregnant?" "When was your last menstrual period?" "Did you deliver in the last 2 weeks?"       no 11. HIGH RISK: "Do you have any heart or lung problems?" (e.g., asthma, COPD, heart failure) "Do you have a weak immune system or other risk factors?" (e.g., HIV positive, chemotherapy, renal failure, diabetes mellitus, sickle cell anemia, obesity)       asthma  Protocols used: Coronavirus (COVID-19) Exposure-A-AH

## 2022-01-15 NOTE — Telephone Encounter (Signed)
Pt called again for status update, also says that she needs a doctor's note for work and that she has not seen that on MyChart yet

## 2022-01-15 NOTE — Patient Instructions (Addendum)
-   Dose molnupiravir for full course - Take Medrol Dosepak for full course - Start Mucinex (guaifenesin) twice daily x7 days - Can use Rx cough medicine on as-needed basis - Can continue work from home restrictions through next week - Can return to work without fever x48 hours - Contact our office for any persistent/worsening symptoms after the above treatments

## 2022-01-15 NOTE — Assessment & Plan Note (Signed)
Patient with 3-day history of upper respiratory symptoms, husband with positive COVID test, prior to virtual visit patient did take COVID test which was positive today.  She describes chest discomfort/congestion, nonproductive cough, shortness of air, denies fever, headaches, body aches, sinus pressure, has been dosing ibuprofen.  Given her stated symptomatology, positive COVID test, sick contacts, I have advised molnupiravir course, given her respiratory history, Medrol Dosepak, scheduled Mucinex, and as needed Rx antitussive in addition to supportive care.  Work from home restrictions x1 week were advised and note provided via MyChart, is to contact her office for any persistent/worsening symptoms after the above medications.  If noted, can consider adjunct therapy.

## 2022-01-15 NOTE — Telephone Encounter (Signed)
Please review.  KP

## 2022-01-15 NOTE — Progress Notes (Signed)
Primary Care / Sports Medicine Virtual Visit  Patient Information:  Patient ID: Alison Logan, female DOB: [birth year] Age: 46 y.o. MRN: [id number]   Alison Logan is a pleasant 46 y.o. female presenting with the following:  Chief Complaint  Patient presents with   Covid Exposure    Has not tested positive, pt husband tested positive,started having symptoms 3 days go, cough and SOB, chest congestion, no fever, headaches and body aches, ibuprofen     Review of Systems: No fevers, chills, night sweats, weight loss, chest pain, or shortness of breath.   Patient Active Problem List   Diagnosis Date Noted   COVID 01/15/2022   Lumbosacral spondylosis with radiculopathy 12/08/2021   Spondylosis of thoracolumbar spine 12/08/2021   Chronic cough 08/27/2021   Colon cancer screening    Polyp of sigmoid colon    Rhinosinusitis 06/02/2021   Fatigue 03/16/2021   Morbid obesity ([place]) 03/16/2021   Annual physical exam 03/16/2021   Multiple nevi 03/16/2021   Neck pain 10/24/2019   Pain in thoracic spine 10/24/2019   DDD (degenerative disc disease), cervical 10/12/2019   Strain of neck muscle 10/12/2019   Postoperative hypothyroidism 03/03/2017   OSA (obstructive sleep apnea) 02/07/2017   Migraine 01/05/2017   Mild intermittent asthma without complication 44/31/5400   Tree nut allergy 01/05/2017   Allergic rhinitis 12/21/2016   Chronic pansinusitis 12/21/2016   Headache disorder 12/21/2016   Sleep-disordered breathing 12/21/2016   Past Medical History:  Diagnosis Date   Allergy    Asthma    Migraines    Ovarian cyst    Sleep apnea    no CPAP   Thyroid disease    Outpatient Encounter Medications as of 01/15/2022  Medication Sig   albuterol (VENTOLIN HFA) 108 (90 Base) MCG/ACT inhaler Inhale 2 puffs into the lungs every 6 (six) hours as needed for wheezing or shortness of breath.   Ascorbic Acid (VITAMIN C) 1000 MG tablet Take 1,000 mg by mouth daily.   gabapentin  (NEURONTIN) 100 MG capsule TAKE 1 CAPSULE BY MOUTH AT BEDTIME.   levothyroxine (SYNTHROID) 88 MCG tablet Take 88 mcg by mouth daily.   methylPREDNISolone (MEDROL DOSEPAK) 4 MG TBPK tablet Take per package instructions x 6 day course   molnupiravir EUA (LAGEVRIO) 200 mg CAPS capsule Take 4 capsules (800 mg total) by mouth 2 (two) times daily for 5 days.   Multiple Vitamin (MULTIVITAMIN) tablet Take 1 tablet by mouth daily.   promethazine-dextromethorphan (PROMETHAZINE-DM) 6.25-15 MG/5ML syrup Take 5 mLs by mouth 4 (four) times daily as needed for cough.   UBRELVY 100 MG TABS Take 100 mg by mouth daily as needed.   liothyronine (CYTOMEL) 5 MCG tablet Take 5 mcg by mouth daily.   [DISCONTINUED] diclofenac (VOLTAREN) 50 MG EC tablet TAKE 1 TABLET BY MOUTH TWICE A DAY   [DISCONTINUED] EPINEPHrine 0.3 mg/0.3 mL IJ SOAJ injection Inject 0.3 mg into the muscle once as needed.   [DISCONTINUED] methocarbamol (ROBAXIN) 500 MG tablet Take 1 tablet (500 mg total) by mouth 2 (two) times daily.   [DISCONTINUED] predniSONE (STERAPRED UNI-PAK 21 TAB) 10 MG (21) TBPK tablet Take by mouth daily. Take 6 tabs by mouth daily  for 2 days, then 5 tabs for 2 days, then 4 tabs for 2 days, then 3 tabs for 2 days, 2 tabs for 2 days, then 1 tab by mouth daily for 2 days   [DISCONTINUED] traMADol (ULTRAM) 50 MG tablet Take 1 tablet (50 mg total)  by mouth every 8 (eight) hours as needed for moderate pain. Maximum 6 tabs per day.   No facility-administered encounter medications on file as of 01/15/2022.   Past Surgical History:  Procedure Laterality Date   COLONOSCOPY WITH PROPOFOL N/A 06/26/2021   Procedure: COLONOSCOPY WITH BIOPSY;  Surgeon: Lucilla Lame, MD;  Location: [facility];  Service: Endoscopy;  Laterality: N/A;   LAPAROSCOPY  1997   NASAL SINUS SURGERY  2018   POLYPECTOMY N/A 06/26/2021   Procedure: POLYPECTOMY;  Surgeon: Lucilla Lame, MD;  Location: [facility];  Service: Endoscopy;  Laterality: N/A;    RHINOPLASTY  2018   THYROIDECTOMY  2012   WISDOM TOOTH EXTRACTION Bilateral 1996    Virtual Visit via MyChart Video:   I connected with Alison Logan on 01/15/22 via MyChart Video and verified that I am speaking with the correct person using appropriate identifiers.   The limitations, risks, security and privacy concerns of performing an evaluation and management service by MyChart Video, including the higher likelihood of inaccurate diagnoses and treatments, and the availability of in person appointments were reviewed. The possible need of an additional face-to-face encounter for complete and high quality delivery of care was discussed. The patient was also made aware that there may be a patient responsible charge related to this service. The patient expressed understanding and wishes to proceed.  Provider location is in medical facility. Patient location is at their home, different from provider location. People involved in care of the patient during this telehealth encounter were myself, my nurse/medical assistant, and my front office/scheduling team member.  Objective findings:   General: Speaking full sentences, no audible heavy breathing. Sounds alert and appropriately interactive. Well-appearing. Face symmetric. Extraocular movements intact. Pupils equal and round. No nasal flaring or accessory muscle use visualized.  Independent interpretation of notes and tests performed by another provider:   None  Pertinent History, Exam, Impression, and Recommendations:   COVID Patient with 3-day history of upper respiratory symptoms, husband with positive COVID test, prior to virtual visit patient did take COVID test which was positive today.  She describes chest discomfort/congestion, nonproductive cough, shortness of air, denies fever, headaches, body aches, sinus pressure, has been dosing ibuprofen.  Given her stated symptomatology, positive COVID test, sick contacts, I have advised  molnupiravir course, given her respiratory history, Medrol Dosepak, scheduled Mucinex, and as needed Rx antitussive in addition to supportive care.  Work from home restrictions x1 week were advised and note provided via MyChart, is to contact her office for any persistent/worsening symptoms after the above medications.  If noted, can consider adjunct therapy.  Orders & Medications Meds ordered this encounter  Medications   promethazine-dextromethorphan (PROMETHAZINE-DM) 6.25-15 MG/5ML syrup    Sig: Take 5 mLs by mouth 4 (four) times daily as needed for cough.    Dispense:  118 mL    Refill:  0   molnupiravir EUA (LAGEVRIO) 200 mg CAPS capsule    Sig: Take 4 capsules (800 mg total) by mouth 2 (two) times daily for 5 days.    Dispense:  40 capsule    Refill:  0   methylPREDNISolone (MEDROL DOSEPAK) 4 MG TBPK tablet    Sig: Take per package instructions x 6 day course    Dispense:  21 tablet    Refill:  0   No orders of the defined types were placed in this encounter.    I discussed the above assessment and treatment plan with the patient. The patient  was provided an opportunity to ask questions and all were answered. The patient agreed with the plan and demonstrated an understanding of the instructions.   The patient was advised to call back or seek an in-person evaluation if the symptoms worsen or if the condition fails to improve as anticipated.   I provided a total time of 30 minutes including both face-to-face and non-face-to-face time on 01/15/2022 inclusive of time utilized for medical chart review, information gathering, care coordination with staff, and documentation completion.    Montel Culver, MD   Primary Care Sports Medicine [place]

## 2022-01-18 ENCOUNTER — Encounter: Payer: Self-pay | Admitting: Family Medicine

## 2022-01-18 NOTE — Telephone Encounter (Signed)
Pt was able to get medication from another pharmacy.

## 2022-01-18 NOTE — Telephone Encounter (Signed)
Please advise 

## 2022-01-22 ENCOUNTER — Ambulatory Visit: Payer: 59 | Admitting: Family Medicine

## 2022-02-04 ENCOUNTER — Other Ambulatory Visit: Payer: Self-pay | Admitting: Family Medicine

## 2022-02-04 DIAGNOSIS — M4727 Other spondylosis with radiculopathy, lumbosacral region: Secondary | ICD-10-CM

## 2022-02-04 DIAGNOSIS — M47815 Spondylosis without myelopathy or radiculopathy, thoracolumbar region: Secondary | ICD-10-CM

## 2022-02-04 NOTE — Telephone Encounter (Signed)
Unable to refill per protocol, Rx expired. Medication was discontinued 01/15/22. Will refuse request.  Requested Prescriptions  Pending Prescriptions Disp Refills  . diclofenac (VOLTAREN) 50 MG EC tablet [Pharmacy Med Name: DICLOFENAC SOD EC 50 MG TAB] 60 tablet 0    Sig: TAKE 1 TABLET BY MOUTH TWICE A DAY     Analgesics:  NSAIDS Failed - 02/04/2022  2:11 AM      Failed - Manual Review: Labs are only required if the patient has taken medication for more than 8 weeks.      Failed - Cr in normal range and within 360 days    Creatinine, Ser  Date Value Ref Range Status  03/30/2018 1.10 (H) 0.44 - 1.00 mg/dL Final         Failed - eGFR is 30 or above and within 360 days    GFR calc Af Amer  Date Value Ref Range Status  03/30/2018 >60 >60 mL/min Final    Comment:    (NOTE) The eGFR has been calculated using the CKD EPI equation. This calculation has not been validated in all clinical situations. eGFR's persistently <60 mL/min signify possible Chronic Kidney Disease.    GFR calc non Af Amer  Date Value Ref Range Status  03/30/2018 >60 >60 mL/min Final         Passed - HGB in normal range and within 360 days    Hemoglobin  Date Value Ref Range Status  03/17/2021 15.2 11.1 - 15.9 g/dL Final         Passed - PLT in normal range and within 360 days    Platelets  Date Value Ref Range Status  03/17/2021 337 150 - 450 x10E3/uL Final         Passed - HCT in normal range and within 360 days    Hematocrit  Date Value Ref Range Status  03/17/2021 46.1 34.0 - 46.6 % Final         Passed - Patient is not pregnant      Passed - Valid encounter within last 12 months    Recent Outpatient Visits          2 weeks ago [place] [facility] at [facility], Earley Abide, MD   1 month ago Lumbosacral spondylosis with radiculopathy   [place] [facility] at [place], Earley Abide, MD   5 months ago Rhinosinusitis    [facility] Primary [facility] and Sports Medicine at [place], Earley Abide, MD   8 months ago Acute bacterial rhinosinusitis   [place] [facility] Medicine at [facility], Earley Abide, MD   10 months ago Annual physical exam   [facility] Primary Care and Sports Medicine at [facility], Earley Abide, MD

## 2022-03-04 IMAGING — CR DG FINGER THUMB 2+V*L*
1 series · 3 of 3 positions shown · non-contrast
Comparison: None.

CLINICAL DATA: Left thumb laceration

EXAM:
LEFT THUMB 2+V

[Series 1: dg finger thumb left · 0.14mm/px · 3 of 3 slices shown]
[im 1/3]
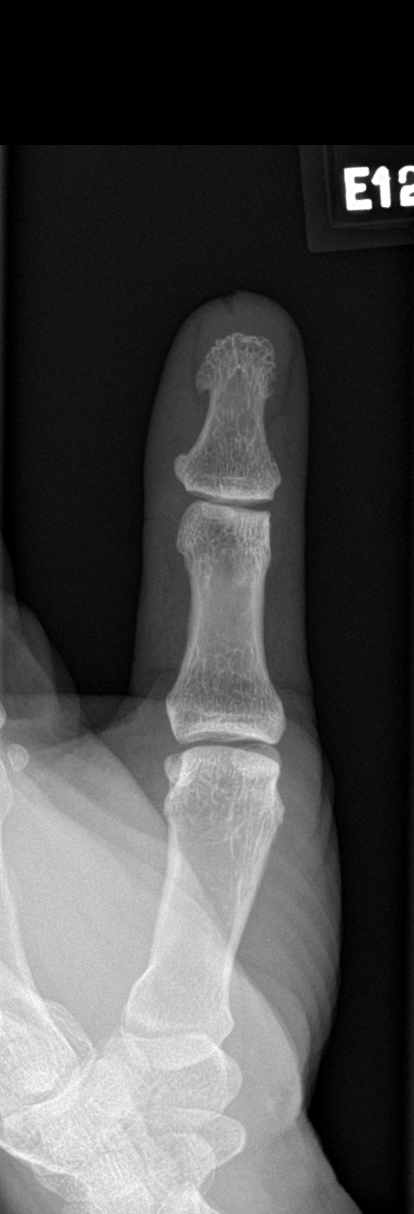
[im 2/3]
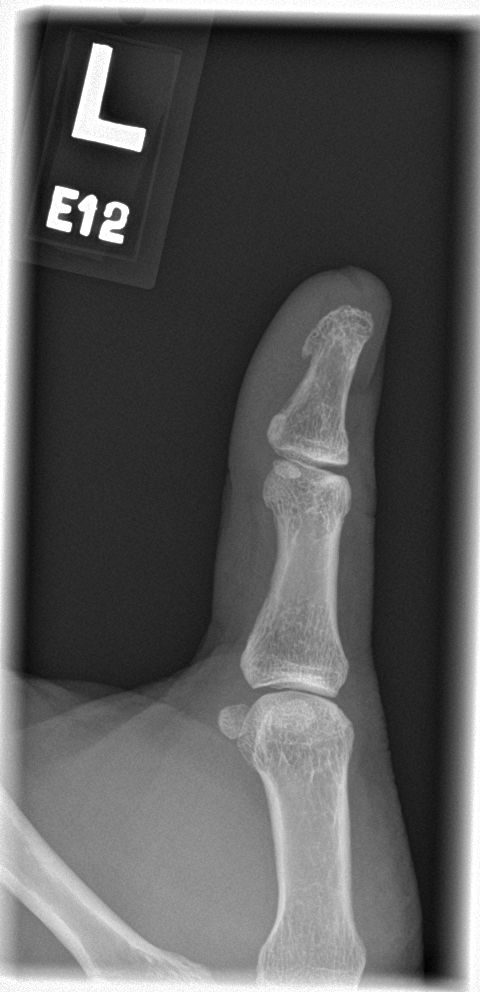
[im 3/3]
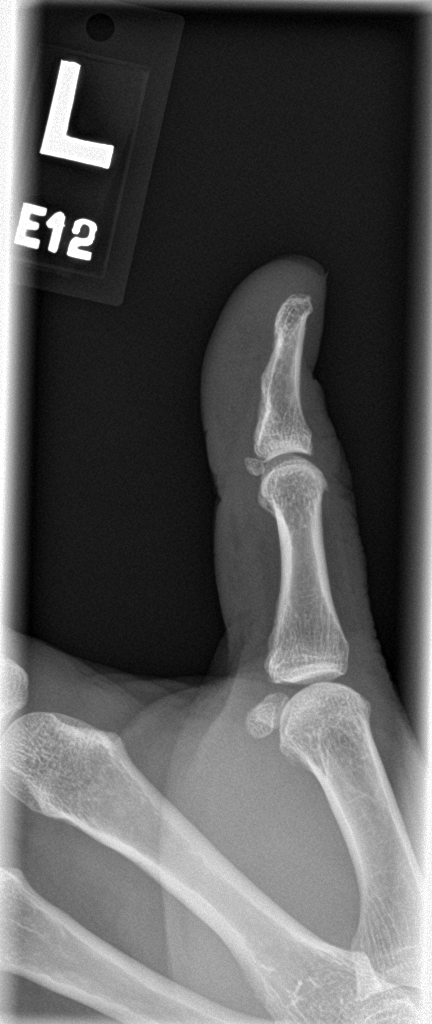

[3 of 3 positions shown; findings below may reference images not displayed]

FINDINGS: There is no evidence of fracture or dislocation. There is no
evidence of arthropathy or other focal bone abnormality. Small soft
tissue defect at the distal thumb compatible with reported
laceration. No radiopaque foreign body.
IMPRESSION: Soft tissue laceration of the distal thumb without underlying
fracture or radiopaque foreign body.

## 2022-04-28 ENCOUNTER — Other Ambulatory Visit: Payer: Self-pay | Admitting: Family Medicine

## 2022-04-28 DIAGNOSIS — M4727 Other spondylosis with radiculopathy, lumbosacral region: Secondary | ICD-10-CM

## 2022-04-29 NOTE — Telephone Encounter (Signed)
Requested medications are due for refill today.  yes  Requested medications are on the active medications list.  yes  Last refill. 01/12/2022  Future visit scheduled.   no  Notes to clinic.  Labs are expired.    Requested Prescriptions  Pending Prescriptions Disp Refills   gabapentin (NEURONTIN) 100 MG capsule [Pharmacy Med Name: GABAPENTIN 100 MG CAPSULE] 30 capsule 1    Sig: TAKE 1 CAPSULE BY MOUTH EVERYDAY AT BEDTIME     Neurology: Anticonvulsants - gabapentin Failed - 04/28/2022  5:56 PM      Failed - Cr in normal range and within 360 days    Creatinine, Ser  Date Value Ref Range Status  03/30/2018 1.10 (H) 0.44 - 1.00 mg/dL Final         Passed - Completed PHQ-2 or PHQ-9 in the last 360 days      Passed - Valid encounter within last 12 months    Recent Outpatient Visits           3 months ago [place] [facility] at [facility], Earley Abide, MD   4 months ago Lumbosacral spondylosis with radiculopathy   [place] [facility] at [place], Earley Abide, MD   8 months ago Rhinosinusitis   [facility] Care and [facility] Medicine at [facility], Earley Abide, MD   11 months ago Acute bacterial rhinosinusitis   [place] [facility] and Sports Medicine at [place], Earley Abide, MD   1 year ago Annual physical exam   [facility] Primary Care and Sports Medicine at [facility], Earley Abide, MD

## 2022-05-17 ENCOUNTER — Other Ambulatory Visit: Payer: Self-pay | Admitting: Family Medicine

## 2022-05-17 DIAGNOSIS — M4727 Other spondylosis with radiculopathy, lumbosacral region: Secondary | ICD-10-CM

## 2022-05-18 NOTE — Telephone Encounter (Signed)
Requested medication (s) are due for refill today: yes  Requested medication (s) are on the active medication list: yes  Last refill:  01/12/22 #30 1 RF  Future visit scheduled: yes  Notes to clinic:  overdue lab work   Requested Prescriptions  Pending Prescriptions Disp Refills   gabapentin (NEURONTIN) 100 MG capsule [Pharmacy Med Name: GABAPENTIN 100 MG CAPSULE] 30 capsule 1    Sig: TAKE 1 CAPSULE BY MOUTH EVERYDAY AT BEDTIME     Neurology: Anticonvulsants - gabapentin Failed - 05/17/2022  6:01 PM      Failed - Cr in normal range and within 360 days    Creatinine, Ser  Date Value Ref Range Status  03/30/2018 1.10 (H) 0.44 - 1.00 mg/dL Final         Passed - Completed PHQ-2 or PHQ-9 in the last 360 days      Passed - Valid encounter within last 12 months    Recent Outpatient Visits           4 months ago [place] [facility] at [facility], Earley Abide, MD   5 months ago Lumbosacral spondylosis with radiculopathy   [place] [facility] and [facility] Medicine at [place], Earley Abide, MD   8 months ago Rhinosinusitis   [facility] Primary Care and Sports Medicine at [facility], Earley Abide, MD   11 months ago Acute bacterial rhinosinusitis   [place] [facility] Medicine at [place], Earley Abide, MD   1 year ago Annual physical exam   [facility] Primary Care and Sports Medicine at [facility], Earley Abide, MD

## 2022-05-21 ENCOUNTER — Other Ambulatory Visit: Payer: Self-pay | Admitting: Family Medicine

## 2022-05-21 DIAGNOSIS — M4727 Other spondylosis with radiculopathy, lumbosacral region: Secondary | ICD-10-CM

## 2022-05-21 NOTE — Telephone Encounter (Signed)
Requested medications are due for refill today.  yes  Requested medications are on the active medications list.  yes  Last refill. 01/12/2022 #30 1 rf  Future visit scheduled.   no  Notes to clinic.  Pr last seen 10/22/2021.     Requested Prescriptions  Pending Prescriptions Disp Refills   gabapentin (NEURONTIN) 100 MG capsule [Pharmacy Med Name: GABAPENTIN 100 MG CAPSULE] 30 capsule 1    Sig: TAKE 1 CAPSULE BY MOUTH EVERYDAY AT BEDTIME     Neurology: Anticonvulsants - gabapentin Failed - 05/21/2022  5:19 PM      Failed - Cr in normal range and within 360 days    Creatinine, Ser  Date Value Ref Range Status  03/30/2018 1.10 (H) 0.44 - 1.00 mg/dL Final         Passed - Completed PHQ-2 or PHQ-9 in the last 360 days      Passed - Valid encounter within last 12 months    Recent Outpatient Visits           4 months ago [place] [facility] Medicine at [facility], Earley Abide, MD   5 months ago Lumbosacral spondylosis with radiculopathy   [place] [facility] at [place], Earley Abide, MD   8 months ago Rhinosinusitis   [facility] Primary Care and Sports Medicine at [facility], Earley Abide, MD   11 months ago Acute bacterial rhinosinusitis   [place] [facility] and Sports Medicine at [place], Earley Abide, MD   1 year ago Annual physical exam   [facility] Primary Care and Sports Medicine at [facility], Earley Abide, MD

## 2022-05-27 ENCOUNTER — Encounter: Payer: Self-pay | Admitting: Family Medicine

## 2022-05-27 ENCOUNTER — Ambulatory Visit (INDEPENDENT_AMBULATORY_CARE_PROVIDER_SITE_OTHER): Payer: 59 | Admitting: Family Medicine

## 2022-05-27 VITALS — BP 104/62 | HR 78 | Ht 64.0 in | Wt 209.0 lb

## 2022-05-27 DIAGNOSIS — M4727 Other spondylosis with radiculopathy, lumbosacral region: Secondary | ICD-10-CM | POA: Diagnosis not present

## 2022-05-27 DIAGNOSIS — Z1231 Encounter for screening mammogram for malignant neoplasm of breast: Secondary | ICD-10-CM

## 2022-05-27 MED ORDER — MELOXICAM 15 MG PO TABS: 15.0000 mg | ORAL_TABLET | Freq: Every day | ORAL | 0 refills | Status: DC

## 2022-05-27 MED ORDER — GABAPENTIN 100 MG PO CAPS: 100.0000 mg | ORAL_CAPSULE | Freq: Every evening | ORAL | 0 refills | Status: DC | PRN

## 2022-05-27 NOTE — Patient Instructions (Signed)
-  Start home exercises -Take meloxicam once daily with food x 2 weeks, after 2 weeks take once daily on as-needed basis for low back/hip symptoms -Can use 100-200 mg gabapentin nightly on an as-needed basis -Check in with GYN for well woman exam -Coordinator will contact to schedule mammogram -Return for follow-up in 3 months for annual physical

## 2022-05-27 NOTE — Assessment & Plan Note (Signed)
Ordered today.  Will also follow-up with gynecology for routine well woman health maintenance.

## 2022-05-27 NOTE — Assessment & Plan Note (Signed)
Chronic condition with ongoing symptomatology.  She has improvement with as needed dosing of gabapentin 100 mg though not full resolution.  She do not get an opportunity start physical therapy.  Denies any adverse effects from gabapentin.  Examination today with limited range of motion at the left hip, positive straight leg raise on the left, contra benign, positive Corky Sox and FADIR localizing to the lumbosacral region posteriorly and groin anteriorly, contralateral benign.  Tenderness at bilateral, left greater than right greater trochanteric regions, minimal tenderness at the sacroiliac region symmetrically.  Constellation of findings are most consistent with her known underlying degenerative changes at the lumbosacral spine and associated compensatory muscular findings including greater trochanteric pain syndrome.  I have advised a 2-week course of meloxicam followed by Perin dosing, titration of gabapentin to 100-200 mg nightly as needed, and home-based exercises.

## 2022-05-27 NOTE — Progress Notes (Signed)
     Primary Care / Sports Medicine Office Visit  Patient Information:  Patient ID: LESSLY STIGLER, female DOB: [birth year] Age: 47 y.o. MRN: [id number]   SHAMIKA PEDREGON is a pleasant 47 y.o. female presenting with the following:  Chief Complaint  Patient presents with   Medication Refill    Refill on Gabapentin for back pain    Vitals:   05/27/22 0827  BP: 104/62  Pulse: 78  SpO2: 99%   Vitals:   05/27/22 0827  Weight: 209 lb (94.8 kg)  Height: '5\' 4"'$  (1.626 m)   Body mass index is 35.87 kg/m.  No results found.   Independent interpretation of notes and tests performed by another provider:   None  Procedures performed:   None  Pertinent History, Exam, Impression, and Recommendations:   Paytience was seen today for medication refill.  Lumbosacral spondylosis with radiculopathy Overview: 1. No evidence of acute injury in the thoracic or lumbar spine. 2. Minimal degenerative endplate change in the midthoracic spine. 3. Minimal degenerative endplate change in the lumbar spine and mild facet arthropathy at L4-L5 and L5-S1.  Assessment & Plan: Chronic condition with ongoing symptomatology.  She has improvement with as needed dosing of gabapentin 100 mg though not full resolution.  She do not get an opportunity start physical therapy.  Denies any adverse effects from gabapentin.  Examination today with limited range of motion at the left hip, positive straight leg raise on the left, contra benign, positive Corky Sox and FADIR localizing to the lumbosacral region posteriorly and groin anteriorly, contralateral benign.  Tenderness at bilateral, left greater than right greater trochanteric regions, minimal tenderness at the sacroiliac region symmetrically.  Constellation of findings are most consistent with her known underlying degenerative changes at the lumbosacral spine and associated compensatory muscular findings including greater trochanteric pain syndrome.  I have  advised a 2-week course of meloxicam followed by Perin dosing, titration of gabapentin to 100-200 mg nightly as needed, and home-based exercises.  Orders: -     Meloxicam; Take 1 tablet (15 mg total) by mouth daily. Take daily x 2 weeks then daily as-needed  Dispense: 30 tablet; Refill: 0 -     Gabapentin; Take 1-2 capsules (100-200 mg total) by mouth at bedtime as needed.  Dispense: 90 capsule; Refill: 0  Screening mammogram for breast cancer Assessment & Plan: Ordered today.  Will also follow-up with gynecology for routine well woman health maintenance.  Orders: -     3D Screening Mammogram, Left and Right; Future     Orders & Medications Meds ordered this encounter  Medications   meloxicam (MOBIC) 15 MG tablet    Sig: Take 1 tablet (15 mg total) by mouth daily. Take daily x 2 weeks then daily as-needed    Dispense:  30 tablet    Refill:  0   gabapentin (NEURONTIN) 100 MG capsule    Sig: Take 1-2 capsules (100-200 mg total) by mouth at bedtime as needed.    Dispense:  90 capsule    Refill:  0    DX Code Needed  .   Orders Placed This Encounter  Procedures   MM 3D SCREEN BREAST BILATERAL     Return in about 3 months (around 08/26/2022) for CPE.     Montel Culver, MD, [facility]   Primary Care Sports Medicine Primary Care and Sports Medicine at [facility]

## 2022-05-28 ENCOUNTER — Encounter: Payer: Self-pay | Admitting: Family Medicine

## 2022-05-28 ENCOUNTER — Telehealth (INDEPENDENT_AMBULATORY_CARE_PROVIDER_SITE_OTHER): Payer: 59 | Admitting: Family Medicine

## 2022-05-28 DIAGNOSIS — J069 Acute upper respiratory infection, unspecified: Secondary | ICD-10-CM

## 2022-05-28 MED ORDER — PROMETHAZINE-DM 6.25-15 MG/5ML PO SYRP: 5.0000 mL | ORAL_SOLUTION | Freq: Four times a day (QID) | ORAL | 0 refills | Status: DC | PRN

## 2022-05-28 MED ORDER — AZELASTINE HCL 0.1 % NA SOLN: 2.0000 | Freq: Two times a day (BID) | NASAL | 1 refills | Status: DC

## 2022-05-28 NOTE — Telephone Encounter (Signed)
Appt

## 2022-05-28 NOTE — Progress Notes (Signed)
Primary Care / Sports Medicine Virtual Visit  Patient Information:  Patient ID: Alison Logan, female DOB: Jan 04, 1976 Age: 47 y.o. MRN: [id number]   Alison Logan is a pleasant 47 y.o. female presenting with the following:  Chief Complaint  Patient presents with   URI    1 day, cough, nasal congestion    Review of Systems: No fevers, chills, night sweats, weight loss, chest pain, or shortness of breath.   Patient Active Problem List   Diagnosis Date Noted   Viral URI with cough 05/28/2022   Screening mammogram for breast cancer 05/27/2022   COVID 01/15/2022   Lumbosacral spondylosis with radiculopathy 12/08/2021   Spondylosis of thoracolumbar spine 12/08/2021   Chronic cough 08/27/2021   Colon cancer screening    Polyp of sigmoid colon    Rhinosinusitis 06/02/2021   Fatigue 03/16/2021   Morbid obesity ([place]) 03/16/2021   Annual physical exam 03/16/2021   Multiple nevi 03/16/2021   Neck pain 10/24/2019   Pain in thoracic spine 10/24/2019   DDD (degenerative disc disease), cervical 10/12/2019   Strain of neck muscle 10/12/2019   Postoperative hypothyroidism 03/03/2017   OSA (obstructive sleep apnea) 02/07/2017   Migraine 01/05/2017   Mild intermittent asthma without complication 54/62/7035   Tree nut allergy 01/05/2017   Allergic rhinitis 12/21/2016   Chronic pansinusitis 12/21/2016   Headache disorder 12/21/2016   Sleep-disordered breathing 12/21/2016   Past Medical History:  Diagnosis Date   Allergy    Asthma    Migraines    Ovarian cyst    Sleep apnea    no CPAP   Thyroid disease    Outpatient Encounter Medications as of 05/28/2022  Medication Sig   albuterol (VENTOLIN HFA) 108 (90 Base) MCG/ACT inhaler Inhale 2 puffs into the lungs every 6 (six) hours as needed for wheezing or shortness of breath.   Ascorbic Acid (VITAMIN C) 1000 MG tablet Take 1,000 mg by mouth daily.   azelastine (ASTELIN) 0.1 % nasal spray Place 2 sprays into both nostrils 2  (two) times daily. Use in each nostril as directed   gabapentin (NEURONTIN) 100 MG capsule Take 1-2 capsules (100-200 mg total) by mouth at bedtime as needed.   levothyroxine (SYNTHROID) 88 MCG tablet Take 88 mcg by mouth daily.   meloxicam (MOBIC) 15 MG tablet Take 1 tablet (15 mg total) by mouth daily. Take daily x 2 weeks then daily as-needed   Multiple Vitamin (MULTIVITAMIN) tablet Take 1 tablet by mouth daily.   promethazine-dextromethorphan (PROMETHAZINE-DM) 6.25-15 MG/5ML syrup Take 5 mLs by mouth 4 (four) times daily as needed for cough.   UBRELVY 100 MG TABS Take 100 mg by mouth daily as needed.   liothyronine (CYTOMEL) 5 MCG tablet Take 5 mcg by mouth daily.   No facility-administered encounter medications on file as of 05/28/2022.   Past Surgical History:  Procedure Laterality Date   COLONOSCOPY WITH PROPOFOL N/A 06/26/2021   Procedure: COLONOSCOPY WITH BIOPSY;  Surgeon: Lucilla Lame, MD;  Location: [place];  Service: Endoscopy;  Laterality: N/A;   LAPAROSCOPY  1997   NASAL SINUS SURGERY  2018   POLYPECTOMY N/A 06/26/2021   Procedure: POLYPECTOMY;  Surgeon: Lucilla Lame, MD;  Location: [place];  Service: Endoscopy;  Laterality: N/A;   RHINOPLASTY  2018   THYROIDECTOMY  2012   WISDOM TOOTH EXTRACTION Bilateral 1996    Virtual Visit via MyChart Video:   I connected with Tammi Klippel on 05/28/22 via MyChart Video  and verified that I am speaking with the correct person using appropriate identifiers.   The limitations, risks, security and privacy concerns of performing an evaluation and management service by MyChart Video, including the higher likelihood of inaccurate diagnoses and treatments, and the availability of in person appointments were reviewed. The possible need of an additional face-to-face encounter for complete and high quality delivery of care was discussed. The patient was also made aware that there may be a patient responsible charge related  to this service. The patient expressed understanding and wishes to proceed.  Provider location is in medical facility. Patient location is at their home, different from provider location. People involved in care of the patient during this telehealth encounter were myself, my nurse/medical assistant, and my front office/scheduling team member.  Objective findings:   General: Speaking full sentences, no audible heavy breathing. Sounds alert and appropriately interactive. Well-appearing. Face symmetric. Extraocular movements intact. Pupils equal and round. No nasal flaring or accessory muscle use visualized.  Independent interpretation of notes and tests performed by another provider:   None  Pertinent History, Exam, Impression, and Recommendations:   Viral URI with cough Overnight history of productive cough, congestion, more recent onset of myalgias, husband and coworkers with similar symptoms.  Denies any fevers, chills, shortness of air, has had has been using OTC medications with limited response.  Examination limited by nature of virtual visit but no frank erythema noted in oropharynx.  Timing of symptoms, myalgias, multiple sick contacts raises primary concern for viral URI, in addition to supportive care plan for Astelin and as needed Phenergan-dextromethorphan, can contact us in a week if symptoms persist at which point bacterial etiologies to be considered.  Orders & Medications Meds ordered this encounter  Medications   azelastine (ASTELIN) 0.1 % nasal spray    Sig: Place 2 sprays into both nostrils 2 (two) times daily. Use in each nostril as directed    Dispense:  301 mL    Refill:  1    Use generic Astelin   promethazine-dextromethorphan (PROMETHAZINE-DM) 6.25-15 MG/5ML syrup    Sig: Take 5 mLs by mouth 4 (four) times daily as needed for cough.    Dispense:  118 mL    Refill:  0   No orders of the defined types were placed in this encounter.    I discussed the above  assessment and treatment plan with the patient. The patient was provided an opportunity to ask questions and all were answered. The patient agreed with the plan and demonstrated an understanding of the instructions.   The patient was advised to call back or seek an in-person evaluation if the symptoms worsen or if the condition fails to improve as anticipated.   I provided a total time of 30 minutes including both face-to-face and non-face-to-face time on 05/28/2022 inclusive of time utilized for medical chart review, information gathering, care coordination with staff, and documentation completion.    Montel Culver, MD, [facility]   Primary Care Sports Medicine Primary Care and Sports Medicine at [facility]

## 2022-05-28 NOTE — Assessment & Plan Note (Signed)
Overnight history of productive cough, congestion, more recent onset of myalgias, husband and coworkers with similar symptoms.  Denies any fevers, chills, shortness of air, has had has been using OTC medications with limited response.  Examination limited by nature of virtual visit but no frank erythema noted in oropharynx.  Timing of symptoms, myalgias, multiple sick contacts raises primary concern for viral URI, in addition to supportive care plan for Astelin and as needed Phenergan-dextromethorphan, can contact us in a week if symptoms persist at which point bacterial etiologies to be considered.

## 2022-06-20 ENCOUNTER — Other Ambulatory Visit: Payer: Self-pay | Admitting: Family Medicine

## 2022-06-21 NOTE — Telephone Encounter (Signed)
Unable to refill per protocol, Rx request is too soon.  Requested Prescriptions  Pending Prescriptions Disp Refills   Azelastine HCl 137 MCG/SPRAY SOLN [Pharmacy Med Name: AZELASTINE 0.1% (137 MCG) SPRY]  1    Sig: PLACE 2 SPRAYS INTO BOTH NOSTRILS 2 (TWO) TIMES DAILY. USE IN EACH NOSTRIL AS DIRECTED     Ear, Nose, and Throat: Nasal Preparations - Antiallergy Passed - 06/20/2022  9:30 AM      Passed - Valid encounter within last 12 months    Recent Outpatient Visits           3 weeks ago Viral URI with cough   [place] [facility] at [place], Earley Abide, MD   3 weeks ago Lumbosacral spondylosis with radiculopathy   [facility] Primary Care & Sports Medicine at [place], Earley Abide, MD   5 months ago [place] at [place], Earley Abide, MD   6 months ago Lumbosacral spondylosis with radiculopathy   [facility] Primary Care & Sports Medicine at [facility], Earley Abide, MD   9 months ago [place] at [facility], Earley Abide, MD       Future Appointments             In 2 months Zigmund Daniel, Earley Abide, MD [place] at [facility], [facility]

## 2022-06-28 ENCOUNTER — Other Ambulatory Visit: Payer: Self-pay | Admitting: Family Medicine

## 2022-06-28 DIAGNOSIS — M4727 Other spondylosis with radiculopathy, lumbosacral region: Secondary | ICD-10-CM

## 2022-06-29 NOTE — Telephone Encounter (Signed)
Requested medication (s) are due for refill today: yes  Requested medication (s) are on the active medication list: yes  Last refill:  05/27/22  Future visit scheduled: yes  Notes to clinic:  Unable to refill per protocol due to failed labs, no updated results.      Requested Prescriptions  Pending Prescriptions Disp Refills   meloxicam (MOBIC) 15 MG tablet [Pharmacy Med Name: MELOXICAM 15 MG TABLET] 30 tablet 0    Sig: TAKE 1 TABLET BY MOUTH DAILY. TAKE DAILY FOR 2 WEEKS THEN DAILY AS-NEEDED     Analgesics:  COX2 Inhibitors Failed - 06/28/2022  8:44 AM      Failed - Manual Review: Labs are only required if the patient has taken medication for more than 8 weeks.      Failed - HGB in normal range and within 360 days    Hemoglobin  Date Value Ref Range Status  03/17/2021 15.2 11.1 - 15.9 g/dL Final         Failed - Cr in normal range and within 360 days    Creatinine, Ser  Date Value Ref Range Status  03/30/2018 1.10 (H) 0.44 - 1.00 mg/dL Final         Failed - HCT in normal range and within 360 days    Hematocrit  Date Value Ref Range Status  03/17/2021 46.1 34.0 - 46.6 % Final         Failed - AST in normal range and within 360 days    AST  Date Value Ref Range Status  03/30/2018 19 15 - 41 U/L Final         Failed - ALT in normal range and within 360 days    ALT  Date Value Ref Range Status  03/30/2018 20 0 - 44 U/L Final         Failed - eGFR is 30 or above and within 360 days    GFR calc Af Amer  Date Value Ref Range Status  03/30/2018 >60 >60 mL/min Final    Comment:    (NOTE) The eGFR has been calculated using the CKD EPI equation. This calculation has not been validated in all clinical situations. eGFR's persistently <60 mL/min signify possible Chronic Kidney Disease.    GFR calc non Af Amer  Date Value Ref Range Status  03/30/2018 >60 >60 mL/min Final         Passed - Patient is not pregnant      Passed - Valid encounter within last 12  months    Recent Outpatient Visits           1 month ago Viral URI with cough   [place] [facility] [place] [facility] [place], Earley Abide, MD   1 month ago Lumbosacral spondylosis with radiculopathy   [facility] & [facility] Medicine at [place], Earley Abide, MD   5 months ago [place] at [facility], Earley Abide, MD   6 months ago Lumbosacral spondylosis with radiculopathy   [facility] Primary Care & Sports Medicine at [place], Earley Abide, MD   10 months ago [place] at [facility], Earley Abide, MD       Future Appointments             In 1 month Zigmund Daniel, Earley Abide, MD [place]  [facility] at [facility], [facility]

## 2022-07-25 ENCOUNTER — Other Ambulatory Visit: Payer: Self-pay | Admitting: Family Medicine

## 2022-07-27 NOTE — Telephone Encounter (Signed)
Requested medication (s) are due for refill today: routing for review  Requested medication (s) are on the active medication list: yes  Last refill:  05/28/22  Future visit scheduled: yes  Notes to clinic:  Unable to refill per protocol, Rx changed from original, routing for review.      Requested Prescriptions  Pending Prescriptions Disp Refills   Azelastine HCl 137 MCG/SPRAY SOLN [Pharmacy Med Name: AZELASTINE 0.1% (137 MCG) SPRY]  1    Sig: PLACE 2 SPRAYS INTO BOTH NOSTRILS 2 (TWO) TIMES DAILY. USE IN EACH NOSTRIL AS DIRECTED     Ear, Nose, and Throat: Nasal Preparations - Antiallergy Passed - 07/25/2022  2:30 PM      Passed - Valid encounter within last 12 months    Recent Outpatient Visits           2 months ago Viral URI with cough   [place] [facility] at [place], Earley Abide, MD   2 months ago Lumbosacral spondylosis with radiculopathy   [facility] Primary Care & Sports Medicine at [place], Earley Abide, MD   6 months ago [place] at [facility], Earley Abide, MD   7 months ago Lumbosacral spondylosis with radiculopathy   [facility] Primary Care & Sports Medicine at [facility], Earley Abide, MD   11 months ago [place] at [facility], Earley Abide, MD       Future Appointments             In 1 month Zigmund Daniel, Earley Abide, MD [place] at [facility], [facility]

## 2022-08-26 ENCOUNTER — Encounter: Payer: Self-pay | Admitting: Family Medicine

## 2022-08-26 ENCOUNTER — Ambulatory Visit (INDEPENDENT_AMBULATORY_CARE_PROVIDER_SITE_OTHER): Payer: 59 | Admitting: Family Medicine

## 2022-08-26 VITALS — BP 120/88 | HR 68 | Ht 64.0 in | Wt 203.0 lb

## 2022-08-26 DIAGNOSIS — L4050 Arthropathic psoriasis, unspecified: Secondary | ICD-10-CM | POA: Insufficient documentation

## 2022-08-26 DIAGNOSIS — Z13220 Encounter for screening for lipoid disorders: Secondary | ICD-10-CM

## 2022-08-26 DIAGNOSIS — Z7689 Persons encountering health services in other specified circumstances: Secondary | ICD-10-CM | POA: Diagnosis not present

## 2022-08-26 DIAGNOSIS — Z124 Encounter for screening for malignant neoplasm of cervix: Secondary | ICD-10-CM

## 2022-08-26 DIAGNOSIS — G8929 Other chronic pain: Secondary | ICD-10-CM

## 2022-08-26 DIAGNOSIS — Z0000 Encounter for general adult medical examination without abnormal findings: Secondary | ICD-10-CM

## 2022-08-26 DIAGNOSIS — Z1231 Encounter for screening mammogram for malignant neoplasm of breast: Secondary | ICD-10-CM | POA: Diagnosis not present

## 2022-08-26 DIAGNOSIS — M7918 Myalgia, other site: Secondary | ICD-10-CM | POA: Diagnosis not present

## 2022-08-26 MED ORDER — WEGOVY 0.5 MG/0.5ML ~~LOC~~ SOAJ: 0.5000 mg | SUBCUTANEOUS | 2 refills | Status: DC

## 2022-08-26 NOTE — Progress Notes (Signed)
Annual Physical Exam Visit  Patient Information:  Patient ID: Alison Logan, female DOB: [birth year] Age: 47 y.o. MRN: [id number]   Subjective:   CC: Annual Physical Exam  HPI:  Alison Logan is here for their annual physical.  I reviewed the past medical history, family history, social history, surgical history, and allergies today and changes were made as necessary.  Please see the problem list section below for additional details.  Past Medical History: Past Medical History:  Diagnosis Date   Allergy    Anxiety    Asthma    Migraines    Ovarian cyst    Sleep apnea    no CPAP   Thyroid disease    Past Surgical History: Past Surgical History:  Procedure Laterality Date   COLONOSCOPY WITH PROPOFOL N/A 06/26/2021   Procedure: COLONOSCOPY WITH BIOPSY;  Surgeon: Midge Minium, MD;  [facility];  Service: Endoscopy;  Laterality: N/A;   LAPAROSCOPY  1997   NASAL SINUS SURGERY  2018   POLYPECTOMY N/A 06/26/2021   Procedure: POLYPECTOMY;  Surgeon: Midge Minium, MD;  Location[facility];  Service: Endoscopy;  Laterality: N/A;   RHINOPLASTY  2018   THYROIDECTOMY  2012   WISDOM TOOTH EXTRACTION Bilateral 1996   Family History: Family History  Problem Relation Age of Onset   Uterine cancer Mother    Pancreatic cancer Father    Celiac disease Son    Stomach cancer Maternal Aunt    Pancreatic cancer Maternal Uncle    Uterine cancer Maternal Grandmother    Colon cancer Paternal Grandfather    Allergies: Allergies  Allergen Reactions   Aspartame Other (See Comments)    Headaches   Tree Extract Anaphylaxis    Tree nuts   Health Maintenance: Health Maintenance  Topic Date Due   INFLUENZA VACCINE  12/09/2022   PAP SMEAR-Modifier  04/22/2024   COLONOSCOPY (Pts 45-77yrs Insurance coverage will need to be confirmed)  06/26/2028   DTaP/Tdap/Td (2 - Td or Tdap) 03/17/2031   Hepatitis C Screening  Completed   HIV Screening  Completed    HPV VACCINES  Aged Out   COVID-19 Vaccine  Discontinued    HM Colonoscopy          COLONOSCOPY (Pts 45-83yrs Insurance coverage will need to be confirmed) (Every 7 Years) Next due on 06/26/2028    06/26/2021  COLONOSCOPY   Only the first 1 history entries have been loaded, but more history exists.           Medications: Current Outpatient Medications on File Prior to Visit  Medication Sig Dispense Refill   albuterol (VENTOLIN HFA) 108 (90 Base) MCG/ACT inhaler Inhale 2 puffs into the lungs every 6 (six) hours as needed for wheezing or shortness of breath. 8 g 2   Ascorbic Acid (VITAMIN C) 1000 MG tablet Take 1,000 mg by mouth daily.     levothyroxine (SYNTHROID) 88 MCG tablet Take 88 mcg by mouth daily.     liothyronine (CYTOMEL) 5 MCG tablet Take 5 mcg by mouth daily.     Multiple Vitamin (MULTIVITAMIN) tablet Take 1 tablet by mouth daily.     UBRELVY 100 MG TABS Take 100 mg by mouth daily as needed.     No current facility-administered medications on file prior to visit.    Review of Systems: No headache, visual changes, nausea, vomiting, diarrhea, constipation, dizziness, abdominal pain, skin rash, fevers, chills, night sweats, swollen lymph nodes, weight loss, chest pain,  body aches, joint swelling, muscle aches, shortness of breath, mood changes, visual or auditory hallucinations reported.  Objective:   Vitals:   08/26/22 0823  BP: 120/88  Pulse: 68  SpO2: 98%   Vitals:   08/26/22 0823  Weight: 203 lb (92.1 kg)  Height:  (1.626 m)   Body mass index is 34.84 kg/m.  General: Well Developed, well nourished, and in no acute distress.  Neuro: Alert and oriented x3, extra-ocular muscles intact, sensation grossly intact. Cranial nerves II through XII are grossly intact, motor, sensory, and coordinative functions are intact. HEENT: Normocephalic, atraumatic, pupils equal round reactive to light, neck supple, no masses, no lymphadenopathy, thyroid nonpalpable.  Oropharynx, nasopharynx, external ear canals are unremarkable. Skin: Warm and dry, no rashes noted.  Cardiac: Regular rate and rhythm, no murmurs rubs or gallops. No peripheral edema. Pulses symmetric. Respiratory: Clear to auscultation bilaterally. Not using accessory muscles, speaking in full sentences.  Abdominal: Soft, nontender, nondistended, positive bowel sounds, no masses, no organomegaly. Musculoskeletal: Shoulder, elbow, wrist, hip, knee, ankle stable, and with full range of motion.  Female chaperone initials: AS present throughout the physical examination.  Impression and Recommendations:   The patient was counselled, risk factors were discussed, and anticipatory guidance given.  Problem List Items Addressed This Visit       Other   Annual physical exam   Relevant Orders   CBC   Comprehensive metabolic panel   Lipid panel   VITAMIN D 25 Hydroxy (Vit-D Deficiency, Fractures)   ANA 12Plus Profile, Do All RDL   Chronic musculoskeletal pain    Does have a known history of spondylosis throughout the spine inclusive of cervical, thoracic, and lumbosacral regions.  That being said, brings up diffuse intermittent sporadic pains.  - Autoimmune panel ordered - If negative, coordinate follow-up for evaluation of additional etiologies      Relevant Orders   ANA 12Plus Profile, Do All RDL   Encounter for weight management    Has previously been started on Wegovy for weight management through her endocrinologist, wishes to continue this medication our office, currently on 0.5 mg weekly and tolerating well with transient nausea on few days after administration.  - Rx Wegovy 0.5 mg weekly - Can continue this a total duration of 3 months (refills provided) - If wanting to titrate this medication she will need an office visit for weight check      Relevant Medications   WEGOVY 0.5 MG/0.5ML SOAJ   Healthcare maintenance - Primary    Annual examination completed, risk  stratification labs ordered, anticipatory guidance provided.  We will follow labs once resulted.       Relevant Orders   CBC   Comprehensive metabolic panel   Lipid panel   VITAMIN D 25 Hydroxy (Vit-D Deficiency, Fractures)   ANA 12Plus Profile, Do All RDL   Other Visit Diagnoses     Encounter for screening mammogram for malignant neoplasm of breast       Relevant Orders   Ambulatory referral to Gynecology   Cervical cancer screening       Relevant Orders   Ambulatory referral to Gynecology   Screening for lipoid disorders       Relevant Orders   Comprehensive metabolic panel   Lipid panel        Orders & Medications Medications:  Meds ordered this encounter  Medications   WEGOVY 0.5 MG/0.5ML SOAJ    Sig: Inject 0.5 mg into the skin once a week.  Dispense:  2 mL    Refill:  2   Orders Placed This Encounter  Procedures   CBC   Comprehensive metabolic panel   Lipid panel   VITAMIN D 25 Hydroxy (Vit-D Deficiency, Fractures)   ANA 12Plus Profile, Do All RDL   Ambulatory referral to Gynecology     No follow-ups on file.    Jerrol Banana, MD, [facility]   Primary Care Sports Medicine Primary Care and Sports Medicine at [facility]

## 2022-08-26 NOTE — Assessment & Plan Note (Signed)
Does have a known history of spondylosis throughout the spine inclusive of cervical, thoracic, and lumbosacral regions.  That being said, brings up diffuse intermittent sporadic pains.  - Autoimmune panel ordered - If negative, coordinate follow-up for evaluation of additional etiologies

## 2022-08-26 NOTE — Assessment & Plan Note (Signed)
Has previously been started on Wegovy for weight management through her endocrinologist, wishes to continue this medication our office, currently on 0.5 mg weekly and tolerating well with transient nausea on few days after administration.  - Rx Wegovy 0.5 mg weekly - Can continue this a total duration of 3 months (refills provided) - If wanting to titrate this medication she will need an office visit for weight check

## 2022-08-26 NOTE — Patient Instructions (Addendum)
-   Obtain fasting labs with orders provided (can have water or black coffee but otherwise no food or drink x 8 hours before labs) - Review information provided - Attend eye doctor annually, dentist every 6 months, work towards or maintain 30 minutes of moderate intensity physical activity at least 5 days per week, and consume a balanced diet - Return in 1 year for physical - Contact us for any questions between now and then  Additionally: - Continue Wegovy 0.5 mg weekly - If tolerating well and want to further increase this medication, contact us to coordinate follow-up - Will contact you with lab results and next steps

## 2022-08-26 NOTE — Assessment & Plan Note (Signed)
Annual examination completed, risk stratification labs ordered, anticipatory guidance provided.  We will follow labs once resulted. 

## 2022-08-30 ENCOUNTER — Other Ambulatory Visit: Payer: Self-pay | Admitting: Family Medicine

## 2022-08-30 NOTE — Telephone Encounter (Signed)
Rx was dc'd on 08/26/22 by provider.   Requested Prescriptions  Refused Prescriptions Disp Refills   Azelastine HCl 137 MCG/SPRAY SOLN [Pharmacy Med Name: AZELASTINE 0.1% (137 MCG) SPRY]      Sig: PLACE 2 SPRAYS INTO BOTH NOSTRILS 2 (TWO) TIMES DAILY. USE IN EACH NOSTRIL AS DIRECTED     Ear, Nose, and Throat: Nasal Preparations - Antiallergy Passed - 08/30/2022  1:40 AM      Passed - Valid encounter within last 12 months    Recent Outpatient Visits           4 days ago Healthcare maintenance   [facility] Primary Care & Sports Medicine at [facility], Ocie Bob, MD   3 months ago Viral URI with cough   [place] Primary Care & Sports Medicine at [facility], Ocie Bob, MD   3 months ago Lumbosacral spondylosis with radiculopathy   [facility] Primary Care & Sports Medicine at [facility], Ocie Bob, MD   7 months ago COVID   [facility] Medicine at [facility], Ocie Bob, MD   8 months ago Lumbosacral spondylosis with radiculopathy   [facility] Primary Care & Sports Medicine at [facility], Ocie Bob, MD       Future Appointments             In 12 months Ashley Royalty, Ocie Bob, MD [facility] Primary Care & Sports Medicine at [facility], [facility]

## 2022-09-03 ENCOUNTER — Telehealth: Payer: Self-pay | Admitting: Family Medicine

## 2022-09-03 LAB — ANA 12PLUS PROFILE, DO ALL RDL
Anti-CCP Ab, IgG & IgA (RDL): <20 Units (ref <20)
Anti-Cardiolipin Ab, IgA (RDL): <12 APL U/mL (ref <12)
Anti-Cardiolipin Ab, IgG (RDL): <15 GPL U/mL (ref <15)
Anti-Cardiolipin Ab, IgM (RDL): <13 MPL U/mL (ref <13)
Anti-Centromere Ab (RDL): <1:40 {titer}
Anti-Chromatin Ab, IgG (RDL): <20 Units (ref <20)
Anti-La (SS-B) Ab (RDL): <20 Units (ref <20)
Anti-Nuclear Ab by IFA (RDL): POSITIVE — AB
Anti-Ro (SS-A) Ab (RDL): <20 Units (ref <20)
Anti-Scl-70 Ab (RDL): <20 Units (ref <20)
Anti-Sm Ab (RDL): <20 Units (ref <20)
Anti-TPO Ab (RDL): <9 IU/mL (ref <9.0)
Anti-U1 RNP Ab (RDL): <20 Units (ref <20)
Anti-dsDNA Ab by Farr(RDL): <8 IU/mL (ref <8.0)
C3 Complement (RDL): 142 mg/dL (ref 82–167)
C4 Complement (RDL): 23 mg/dL (ref 14–44)
Rheumatoid Factor by Turb RDL: <14 IU/mL (ref <14)

## 2022-09-03 LAB — CBC
Hematocrit: 45.7 % (ref 34.0–46.6)
Hemoglobin: 15.6 g/dL (ref 11.1–15.9)
MCH: 32 pg (ref 26.6–33.0)
MCHC: 34.1 g/dL (ref 31.5–35.7)
MCV: 94 fL (ref 79–97)
Platelets: 335 10*3/uL (ref 150–450)
RBC: 4.88 x10E6/uL (ref 3.77–5.28)
RDW: 12.3 % (ref 11.7–15.4)
WBC: 6.9 10*3/uL (ref 3.4–10.8)

## 2022-09-03 LAB — COMPREHENSIVE METABOLIC PANEL
ALT: 20 IU/L (ref 0–32)
AST: 21 IU/L (ref 0–40)
Albumin/Globulin Ratio: 1.7 (ref 1.2–2.2)
Albumin: 4.4 g/dL (ref 3.9–4.9)
Alkaline Phosphatase: 50 IU/L (ref 44–121)
BUN/Creatinine Ratio: 23 (ref 9–23)
BUN: 26 mg/dL — ABNORMAL HIGH (ref 6–24)
Bilirubin Total: 0.5 mg/dL (ref 0.0–1.2)
CO2: 23 mmol/L (ref 20–29)
Calcium: 9.4 mg/dL (ref 8.7–10.2)
Chloride: 101 mmol/L (ref 96–106)
Creatinine, Ser: 1.12 mg/dL — ABNORMAL HIGH (ref 0.57–1.00)
Globulin, Total: 2.6 g/dL (ref 1.5–4.5)
Glucose: 87 mg/dL (ref 70–99)
Potassium: 4.8 mmol/L (ref 3.5–5.2)
Sodium: 139 mmol/L (ref 134–144)
Total Protein: 7 g/dL (ref 6.0–8.5)
eGFR: 61 mL/min/{1.73_m2} (ref >59)

## 2022-09-03 LAB — LIPID PANEL
Chol/HDL Ratio: 3.9 ratio (ref 0.0–4.4)
Cholesterol, Total: 181 mg/dL (ref 100–199)
HDL: 46 mg/dL (ref >39)
LDL Chol Calc (NIH): 121 mg/dL — ABNORMAL HIGH (ref 0–99)
Triglycerides: 76 mg/dL (ref 0–149)
VLDL Cholesterol Cal: 14 mg/dL (ref 5–40)

## 2022-09-03 LAB — ANA TITER AND PATTERN
Homogeneous Pattern: 1:160 {titer} — ABNORMAL HIGH
Speckled Pattern: 1:80 {titer} — ABNORMAL HIGH

## 2022-09-03 LAB — VITAMIN D 25 HYDROXY (VIT D DEFICIENCY, FRACTURES): Vit D, 25-Hydroxy: 28.9 ng/mL — ABNORMAL LOW (ref 30.0–100.0)

## 2022-09-03 NOTE — Telephone Encounter (Signed)
Copied from CRM [id number]. Topic: General - Other >> Sep 03, 2022  2:31 PM Alison Logan wrote: Reason for CRM: The patient would like to be contacted directly by Dr. Ashley Royalty to discuss their recent test results from 08/26/22  Please contact when possible

## 2022-09-06 NOTE — Telephone Encounter (Signed)
fyi

## 2022-09-07 ENCOUNTER — Encounter: Payer: Self-pay | Admitting: Family Medicine

## 2022-09-08 ENCOUNTER — Other Ambulatory Visit: Payer: Self-pay

## 2022-09-08 ENCOUNTER — Other Ambulatory Visit: Payer: Self-pay | Admitting: Family Medicine

## 2022-09-08 DIAGNOSIS — R7989 Other specified abnormal findings of blood chemistry: Secondary | ICD-10-CM

## 2022-09-08 DIAGNOSIS — M7918 Myalgia, other site: Secondary | ICD-10-CM

## 2022-09-08 DIAGNOSIS — R768 Other specified abnormal immunological findings in serum: Secondary | ICD-10-CM

## 2022-09-08 DIAGNOSIS — G8929 Other chronic pain: Secondary | ICD-10-CM

## 2022-09-08 MED ORDER — VITAMIN D (ERGOCALCIFEROL) 1.25 MG (50000 UNIT) PO CAPS: 50000.0000 [IU] | ORAL_CAPSULE | ORAL | 0 refills | Status: DC

## 2022-09-08 NOTE — Progress Notes (Signed)
re

## 2022-09-08 NOTE — Progress Notes (Signed)
Referral placed. Lab ordered.  KP

## 2022-09-08 NOTE — Telephone Encounter (Signed)
Please advise 

## 2022-09-09 ENCOUNTER — Other Ambulatory Visit: Payer: Self-pay | Admitting: *Deleted

## 2022-09-09 ENCOUNTER — Inpatient Hospital Stay
Admission: RE | Admit: 2022-09-09 | Discharge: 2022-09-09 | Disposition: A | Discharge status: Home / Self Care | Payer: Self-pay | Source: Ambulatory Visit | Attending: Family Medicine | Admitting: Family Medicine

## 2022-09-09 ENCOUNTER — Ambulatory Visit
Admission: RE | Admit: 2022-09-09 | Discharge: 2022-09-09 | Disposition: A | Discharge status: Home / Self Care | Payer: 59 | Source: Ambulatory Visit | Attending: Family Medicine | Admitting: Family Medicine

## 2022-09-09 DIAGNOSIS — Z1231 Encounter for screening mammogram for malignant neoplasm of breast: Secondary | ICD-10-CM | POA: Diagnosis not present

## 2022-09-11 ENCOUNTER — Other Ambulatory Visit: Payer: Self-pay | Admitting: Family Medicine

## 2022-09-13 NOTE — Telephone Encounter (Signed)
Unable to refill per protocol, Rx request is too soon. Last refill 08/26/22 for 2 ml and 2 refills.  Requested Prescriptions  Pending Prescriptions Disp Refills   WEGOVY 1 MG/0.5ML SOAJ [Pharmacy Med Name: WEGOVY 1 MG/0.5 ML PEN]      Sig: INJECT 0.5 MLS (1 MG TOTAL) SUBCUTANEOUSLY ONCE A WEEK FOR 28 DAYS     Endocrinology:  Diabetes - GLP-1 Receptor Agonists - semaglutide Failed - 09/11/2022  5:30 PM      Failed - HBA1C in normal range and within 180 days    No results found for: "HGBA1C", "LABA1C"       Failed - Cr in normal range and within 360 days    Creatinine, Ser  Date Value Ref Range Status  08/26/2022 1.12 (H) 0.57 - 1.00 mg/dL Final         Passed - Valid encounter within last 6 months    Recent Outpatient Visits           2 weeks ago Healthcare maintenance   [facility] Primary Care & Sports Medicine at [facility], Ocie Bob, MD   3 months ago Viral URI with cough   [place] Primary Care & Sports Medicine at [facility], Ocie Bob, MD   3 months ago Lumbosacral spondylosis with radiculopathy   [facility] Primary Care & Sports Medicine at [facility], Ocie Bob, MD   8 months ago COVID   [facility] & Sports Medicine at [facility], Ocie Bob, MD   9 months ago Lumbosacral spondylosis with radiculopathy   [facility] Primary Care & [facility] Medicine at [facility], Ocie Bob, MD       Future Appointments             In 11 months Ashley Royalty, Ocie Bob, MD [facility] Health Primary Care & Sports Medicine at [facility], [facility]

## 2022-09-15 ENCOUNTER — Other Ambulatory Visit: Payer: Self-pay | Admitting: Family Medicine

## 2022-09-15 NOTE — Telephone Encounter (Signed)
Unable to refill per protocol, Rx request is too soon. Last refill 08/26/22.E-Prescribing Status: Receipt confirmed by pharmacy (08/26/2022  8:52 AM EDT).  Requested Prescriptions  Pending Prescriptions Disp Refills   WEGOVY 1 MG/0.5ML SOAJ [Pharmacy Med Name: WEGOVY 1 MG/0.5 ML PEN]      Sig: INJECT 0.5 MLS (1 MG TOTAL) SUBCUTANEOUSLY ONCE A WEEK FOR 28 DAYS     Endocrinology:  Diabetes - GLP-1 Receptor Agonists - semaglutide Failed - 09/15/2022 11:09 AM      Failed - HBA1C in normal range and within 180 days    No results found for: "HGBA1C", "LABA1C"       Failed - Cr in normal range and within 360 days    Creatinine, Ser  Date Value Ref Range Status  08/26/2022 1.12 (H) 0.57 - 1.00 mg/dL Final         Passed - Valid encounter within last 6 months    Recent Outpatient Visits           2 weeks ago Healthcare maintenance   [facility] Health Primary Care & Sports Medicine at [facility], Ocie Bob, MD   3 months ago Viral URI with cough   [place] Primary Care & Sports Medicine at [facility], Ocie Bob, MD   3 months ago Lumbosacral spondylosis with radiculopathy   [facility] Primary Care & Sports Medicine at [facility], Ocie Bob, MD   8 months ago COVID   [facility] Primary Care & Sports Medicine at [facility], Ocie Bob, MD   9 months ago Lumbosacral spondylosis with radiculopathy   [facility] Primary Care & Sports Medicine at [facility], Ocie Bob, MD       Future Appointments             In 11 months Ashley Royalty, Ocie Bob, MD [facility] Health Primary Care & Sports Medicine at [facility], [facility]

## 2022-09-16 ENCOUNTER — Encounter: Payer: Self-pay | Admitting: Family Medicine

## 2022-10-01 ENCOUNTER — Other Ambulatory Visit: Payer: Self-pay | Admitting: Family Medicine

## 2022-10-01 NOTE — Telephone Encounter (Signed)
Requested medications are due for refill today.  no  Requested medications are on the active medications list.  yes  Last refill. 09/08/2022 #9 0 rf  Future visit scheduled.   yes  Notes to clinic.  Requesting 90 day supply.    Requested Prescriptions  Pending Prescriptions Disp Refills   Vitamin D, Ergocalciferol, (DRISDOL) 1.25 MG (50000 UNIT) CAPS capsule [Pharmacy Med Name: VITAMIN D2 1.25MG (50,000 UNIT)] 12 capsule 1    Sig: Take 1 capsule (50,000 Units total) by mouth every 7 (seven) days. Take for 8 total doses(weeks)     Endocrinology:  Vitamins - Vitamin D Supplementation 2 Failed - 10/01/2022  8:32 AM      Failed - Manual Review: Route requests for 50,000 IU strength to the provider      Failed - Vitamin D in normal range and within 360 days    Vit D, 25-Hydroxy  Date Value Ref Range Status  08/26/2022 28.9 (L) 30.0 - 100.0 ng/mL Final    Comment:    Vitamin D deficiency has been defined by the Institute of Medicine and an Endocrine Society practice guideline as a level of serum 25-OH vitamin D less than 20 ng/mL (1,2). The Endocrine Society went on to further define vitamin D insufficiency as a level between 21 and 29 ng/mL (2). 1. IOM (Institute of Medicine). 2010. Dietary reference    intakes for calcium and D. [place]: The    [employer]. 2. Holick MF, Binkley [place], Bischoff-Ferrari HA, et al.    Evaluation, treatment, and prevention of vitamin D    deficiency: an Endocrine Society clinical practice    guideline. JCEM. 2011 Jul; 96(7):1911-30.          Passed - Ca in normal range and within 360 days    Calcium  Date Value Ref Range Status  08/26/2022 9.4 8.7 - 10.2 mg/dL Final         Passed - Valid encounter within last 12 months    Recent Outpatient Visits           1 month ago Healthcare maintenance   [facility] Primary Care & Sports Medicine at [facility], Ocie Bob, MD   4 months ago Viral URI with cough   [facility] at [facility], Ocie Bob, MD   4 months ago Lumbosacral spondylosis with radiculopathy   [facility] Primary Care & Sports Medicine at [facility], Ocie Bob, MD   8 months ago COVID   [facility] Care & [facility] Medicine at [facility], Ocie Bob, MD   9 months ago Lumbosacral spondylosis with radiculopathy   [facility] Primary Care & Sports Medicine at [facility], Ocie Bob, MD       Future Appointments             In 11 months Ashley Royalty, Ocie Bob, MD [facility] Primary Care & Sports Medicine at [facility], [facility]

## 2022-11-08 ENCOUNTER — Ambulatory Visit (INDEPENDENT_AMBULATORY_CARE_PROVIDER_SITE_OTHER): Payer: 59 | Admitting: Family Medicine

## 2022-11-08 ENCOUNTER — Encounter: Payer: Self-pay | Admitting: Family Medicine

## 2022-11-08 VITALS — BP 120/80 | HR 78 | Ht 63.0 in | Wt 190.0 lb

## 2022-11-08 DIAGNOSIS — Z7689 Persons encountering health services in other specified circumstances: Secondary | ICD-10-CM | POA: Diagnosis not present

## 2022-11-08 DIAGNOSIS — R7989 Other specified abnormal findings of blood chemistry: Secondary | ICD-10-CM | POA: Diagnosis not present

## 2022-11-08 MED ORDER — WEGOVY 1 MG/0.5ML ~~LOC~~ SOAJ: 1.0000 mg | SUBCUTANEOUS | 2 refills | Status: DC

## 2022-11-09 LAB — RENAL FUNCTION PANEL
Albumin: 4.3 g/dL (ref 3.9–4.9)
BUN/Creatinine Ratio: 16 (ref 9–23)
BUN: 16 mg/dL (ref 6–24)
CO2: 25 mmol/L (ref 20–29)
Calcium: 9.7 mg/dL (ref 8.7–10.2)
Chloride: 103 mmol/L (ref 96–106)
Creatinine, Ser: 1.02 mg/dL — ABNORMAL HIGH (ref 0.57–1.00)
Glucose: 88 mg/dL (ref 70–99)
Phosphorus: 3.8 mg/dL (ref 3.0–4.3)
Potassium: 5 mmol/L (ref 3.5–5.2)
Sodium: 140 mmol/L (ref 134–144)
eGFR: 69 mL/min/{1.73_m2} (ref >59)

## 2022-11-12 DIAGNOSIS — E890 Postprocedural hypothyroidism: Secondary | ICD-10-CM | POA: Diagnosis not present

## 2022-11-19 DIAGNOSIS — M064 Inflammatory polyarthropathy: Secondary | ICD-10-CM | POA: Diagnosis not present

## 2022-11-19 DIAGNOSIS — J324 Chronic pansinusitis: Secondary | ICD-10-CM | POA: Diagnosis not present

## 2022-11-19 DIAGNOSIS — R21 Rash and other nonspecific skin eruption: Secondary | ICD-10-CM | POA: Diagnosis not present

## 2022-11-19 NOTE — Progress Notes (Signed)
     Primary Care / Sports Medicine Office Visit  Patient Information:  Patient ID: Alison Logan, female DOB: 02-02-1976 Age: 47 y.o. MRN: [id number]   Alison Logan is a pleasant 47 y.o. female presenting with the following:  Chief Complaint  Patient presents with   Weight Check    Vitals:   11/08/22 0842  BP: 120/80  Pulse: 78  SpO2: 98%   Vitals:   11/08/22 0842  Weight: 190 lb (86.2 kg)  Height: 5\' 3"  (1.6 m)   Body mass index is 33.66 kg/m.  No results found.   Independent interpretation of notes and tests performed by another provider:   None  Procedures performed:   None  Pertinent History, Exam, Impression, and Recommendations:   Alison Logan was seen today for weight check.  Encounter for weight management Assessment & Plan: Tolerating 0.5 mg Wegovy weekly without issues.  - Further titration to Wegovy 1.0 mg weekly - Return as scheduled, interim visits can be coordinated for further titration   Other orders -     FAOZHY; Inject 1 mg into the skin once a week.  Dispense: 2 mL; Refill: 2     Orders & Medications Meds ordered this encounter  Medications   WEGOVY 1 MG/0.5ML SOAJ    Sig: Inject 1 mg into the skin once a week.    Dispense:  2 mL    Refill:  2   No orders of the defined types were placed in this encounter.    No follow-ups on file.     Jerrol Banana, MD, [facility]   Primary Care Sports Medicine Primary Care and Sports Medicine at [facility]

## 2022-11-19 NOTE — Assessment & Plan Note (Signed)
Tolerating 0.5 mg Wegovy weekly without issues.  - Further titration to Wegovy 1.0 mg weekly - Return as scheduled, interim visits can be coordinated for further titration

## 2022-11-25 DIAGNOSIS — M064 Inflammatory polyarthropathy: Secondary | ICD-10-CM | POA: Diagnosis not present

## 2022-11-25 DIAGNOSIS — Z0389 Encounter for observation for other suspected diseases and conditions ruled out: Secondary | ICD-10-CM | POA: Diagnosis not present

## 2022-12-10 ENCOUNTER — Encounter: Payer: Self-pay | Admitting: Family Medicine

## 2022-12-10 ENCOUNTER — Other Ambulatory Visit: Payer: Self-pay

## 2022-12-10 MED ORDER — WEGOVY 1.7 MG/0.75ML ~~LOC~~ SOAJ: 1.7000 mg | SUBCUTANEOUS | 2 refills | Status: DC

## 2022-12-10 NOTE — Telephone Encounter (Signed)
Please advise 

## 2023-01-24 ENCOUNTER — Ambulatory Visit
Admission: RE | Admit: 2023-01-24 | Discharge: 2023-01-24 | Disposition: A | Discharge status: Home / Self Care | Payer: 59 | Attending: Family Medicine | Admitting: Family Medicine

## 2023-01-24 ENCOUNTER — Ambulatory Visit: Payer: 59 | Admitting: Family Medicine

## 2023-01-24 ENCOUNTER — Encounter: Payer: Self-pay | Admitting: Family Medicine

## 2023-01-24 ENCOUNTER — Ambulatory Visit
Admission: RE | Admit: 2023-01-24 | Discharge: 2023-01-24 | Disposition: A | Discharge status: Home / Self Care | Payer: 59 | Source: Ambulatory Visit | Attending: Family Medicine

## 2023-01-24 VITALS — BP 118/78 | HR 89 | Ht 64.0 in | Wt 177.0 lb

## 2023-01-24 DIAGNOSIS — Z7689 Persons encountering health services in other specified circumstances: Secondary | ICD-10-CM | POA: Diagnosis not present

## 2023-01-24 DIAGNOSIS — K5900 Constipation, unspecified: Secondary | ICD-10-CM

## 2023-01-24 DIAGNOSIS — R10812 Left upper quadrant abdominal tenderness: Secondary | ICD-10-CM | POA: Diagnosis not present

## 2023-01-24 MED ORDER — WEGOVY 1 MG/0.5ML ~~LOC~~ SOAJ
1.0000 mg | SUBCUTANEOUS | 2 refills | Status: DC
Start: 2023-01-24 — End: 2023-07-14

## 2023-01-24 NOTE — Patient Instructions (Signed)
-   Consider skipping next dose of Wegovy if symptoms fail to improve - Advance lifestyle changes where able to do so (increased physical activity, etc.) - Start Metamucil and increase to target 1-2 smooth bowel movements daily - Can incorporate stool softener and further treatments as outlined below - Contact us for any worsening symptoms or symptoms that fail to improve  Constipation Stepwise Treatment  Find a consistent over-the-counter regimen that allows for at least 1-2 smooth, formed, bowel movements daily. See steps below and reserve Steps 5 and 6 for times without a bowel movement x 2-3 days despite regular OTC regimen.

## 2023-01-24 NOTE — Assessment & Plan Note (Signed)
Acute on chronic, noted in the setting of concomitant GLP-1 agonist usage, has attempted lifestyle modifications with water and fiber intake.  Having bowel movements every several days, upper abdominal pain intermittently with food intake, has been using enema with positive response.  Did trial oral laxative with worsened symptoms.  Abdominal exam demonstrates soft, minimally tender abdomen in the right upper and left upper quadrants, no rebound, negative Murphy's, hypoactive bowel sounds noted, no hepatosplenomegaly.  Patient with constipation in setting of GLP-1 agonist usage, we described the correlation between the 2 in addition to optimization strategies.  Plan: - Consider skipping next dose of Wegovy if symptoms fail to improve - Advance lifestyle changes where able to do so (increased physical activity, etc.) - Start Metamucil and increase to target 1-2 smooth bowel movements daily - Can incorporate stool softener and further treatments as outlined below - Contact us for any worsening symptoms or symptoms that fail to improve - If suboptimal progress noted, GI referral to be considered

## 2023-01-24 NOTE — Progress Notes (Signed)
Primary Care / Sports Medicine Office Visit  Patient Information:  Patient ID: AINHARA BURRI, female DOB: [birth year] Age: 47 y.o. MRN: [id number]   HIDIE TKACZYK is a pleasant 47 y.o. female presenting with the following:  Chief Complaint  Patient presents with   GI Problem    Pain for 3 weeks, bowel movement 3 days ago    Vitals:   01/24/23 1000  BP: 118/78  Pulse: 89  SpO2: 99%   Vitals:   01/24/23 1000  Weight: 177 lb (80.3 kg)  Height: 5\' 4"  (1.626 m)   Body mass index is 30.38 kg/m.  No results found.   Independent interpretation of notes and tests performed by another provider:   None  Procedures performed:   None  Pertinent History, Exam, Impression, and Recommendations:   Problem List Items Addressed This Visit       Other   Encounter for weight management    Significant weight loss noted on Wegovy 1 mg weekly, did not end up increasing to 1.7 mg due to GI effects. See additional assessment(s) for plan details.  -Low threshold to skip upcoming dose until GI symptoms resolve      Relevant Medications   WEGOVY 1 MG/0.5ML SOAJ   Constipation - Primary    Acute on chronic, noted in the setting of concomitant GLP-1 agonist usage, has attempted lifestyle modifications with water and fiber intake.  Having bowel movements every several days, upper abdominal pain intermittently with food intake, has been using enema with positive response.  Did trial oral laxative with worsened symptoms.  Abdominal exam demonstrates soft, minimally tender abdomen in the right upper and left upper quadrants, no rebound, negative Murphy's, hypoactive bowel sounds noted, no hepatosplenomegaly.  Patient with constipation in setting of GLP-1 agonist usage, we described the correlation between the 2 in addition to optimization strategies.  Plan: - Consider skipping next dose of Wegovy if symptoms fail to improve - Advance lifestyle changes where able to do so (increased  physical activity, etc.) - Start Metamucil and increase to target 1-2 smooth bowel movements daily - Can incorporate stool softener and further treatments as outlined below - Contact us for any worsening symptoms or symptoms that fail to improve - If suboptimal progress noted, GI referral to be considered      Relevant Orders   DG Abd 1 View     Orders & Medications Medications:  Meds ordered this encounter  Medications   WEGOVY 1 MG/0.5ML SOAJ    Sig: Inject 1 mg into the skin once a week.    Dispense:  2 mL    Refill:  2   Orders Placed This Encounter  Procedures   DG Abd 1 View     No follow-ups on file.     Jerrol Banana, MD, [facility]   Primary Care Sports Medicine Primary Care and Sports Medicine at [facility]

## 2023-01-24 NOTE — Assessment & Plan Note (Signed)
Significant weight loss noted on Wegovy 1 mg weekly, did not end up increasing to 1.7 mg due to GI effects. See additional assessment(s) for plan details.  -Low threshold to skip upcoming dose until GI symptoms resolve

## 2023-01-28 ENCOUNTER — Encounter: Payer: Self-pay | Admitting: Family Medicine

## 2023-01-28 NOTE — Telephone Encounter (Signed)
Please review.  KP

## 2023-02-01 DIAGNOSIS — M546 Pain in thoracic spine: Secondary | ICD-10-CM | POA: Diagnosis not present

## 2023-02-01 DIAGNOSIS — M9902 Segmental and somatic dysfunction of thoracic region: Secondary | ICD-10-CM | POA: Diagnosis not present

## 2023-02-01 DIAGNOSIS — M9900 Segmental and somatic dysfunction of head region: Secondary | ICD-10-CM | POA: Diagnosis not present

## 2023-02-08 ENCOUNTER — Ambulatory Visit: Payer: 59 | Admitting: Family Medicine

## 2023-02-08 DIAGNOSIS — M9900 Segmental and somatic dysfunction of head region: Secondary | ICD-10-CM | POA: Diagnosis not present

## 2023-02-08 DIAGNOSIS — M546 Pain in thoracic spine: Secondary | ICD-10-CM | POA: Diagnosis not present

## 2023-02-08 DIAGNOSIS — M9902 Segmental and somatic dysfunction of thoracic region: Secondary | ICD-10-CM | POA: Diagnosis not present

## 2023-03-02 ENCOUNTER — Encounter: Payer: Self-pay | Admitting: Family Medicine

## 2023-03-02 ENCOUNTER — Other Ambulatory Visit: Payer: Self-pay | Admitting: Family Medicine

## 2023-03-02 DIAGNOSIS — M4727 Other spondylosis with radiculopathy, lumbosacral region: Secondary | ICD-10-CM

## 2023-03-02 DIAGNOSIS — Z7689 Persons encountering health services in other specified circumstances: Secondary | ICD-10-CM

## 2023-03-03 ENCOUNTER — Other Ambulatory Visit: Payer: Self-pay

## 2023-03-03 DIAGNOSIS — M4727 Other spondylosis with radiculopathy, lumbosacral region: Secondary | ICD-10-CM

## 2023-03-03 MED ORDER — GABAPENTIN 100 MG PO CAPS
100.0000 mg | ORAL_CAPSULE | Freq: Every evening | ORAL | 0 refills | Status: DC | PRN
Start: 2023-03-03 — End: 2023-04-18

## 2023-03-04 NOTE — Telephone Encounter (Signed)
Unable to refill per protocol, request for  Gabapentin is too soon. Reginal Lutes was discontinued.  Requested Prescriptions  Pending Prescriptions Disp Refills   gabapentin (NEURONTIN) 100 MG capsule [Pharmacy Med Name: GABAPENTIN 100 MG CAPSULE] 60 capsule 1    Sig: TAKE 1-2 CAPSULES (100-200 MG TOTAL) BY MOUTH AT BEDTIME AS NEEDED.     Neurology: Anticonvulsants - gabapentin Failed - 03/02/2023  7:32 PM      Failed - Cr in normal range and within 360 days    Creatinine, Ser  Date Value Ref Range Status  11/08/2022 1.02 (H) 0.57 - 1.00 mg/dL Final         Passed - Completed PHQ-2 or PHQ-9 in the last 360 days      Passed - Valid encounter within last 12 months    Recent Outpatient Visits           1 month ago Constipation, unspecified constipation type   [place] Primary Care & Sports Medicine at [facility], Ocie Bob, MD   3 months ago Encounter for weight management   [place] Primary Care & Sports Medicine at [facility], Ocie Bob, MD   6 months ago Healthcare maintenance   [place] Primary Care & Sports Medicine at [facility], Ocie Bob, MD   9 months ago Viral URI with cough   [place] Primary Care & Sports Medicine at [facility], Ocie Bob, MD   9 months ago Lumbosacral spondylosis with radiculopathy   [place] Primary Care & Sports Medicine at [facility], Ocie Bob, MD               WEGOVY 0.5 MG/0.5ML [place] [Pharmacy Med Name: WEGOVY 0.5 MG/0.5 ML PEN]  2    Sig: INJECT 0.5 MG INTO THE SKIN ONE TIME PER WEEK     Endocrinology:  Diabetes - GLP-1 Receptor Agonists - semaglutide Failed - 03/02/2023  7:32 PM      Failed - HBA1C in normal range and within 180 days    No results found for: "HGBA1C", "LABA1C"       Failed - Cr in normal range and within 360 days    Creatinine, Ser  Date Value Ref Range Status  11/08/2022 1.02 (H) 0.57 - 1.00 mg/dL Final         Passed - Valid encounter  within last 6 months    Recent Outpatient Visits           1 month ago Constipation, unspecified constipation type   [facility] Primary Care & Sports Medicine at [facility], Ocie Bob, MD   3 months ago Encounter for weight management   [place] Primary Care & Sports Medicine at [facility], Ocie Bob, MD   6 months ago Healthcare maintenance   [facility] Care & Sports Medicine at [facility], Ocie Bob, MD   9 months ago Viral URI with cough   [facility] Primary Care & Sports Medicine at [facility], Ocie Bob, MD   9 months ago Lumbosacral spondylosis with radiculopathy   [facility] Health Primary Care & Sports Medicine at [facility], Ocie Bob, MD

## 2023-03-15 DIAGNOSIS — E890 Postprocedural hypothyroidism: Secondary | ICD-10-CM | POA: Diagnosis not present

## 2023-04-18 ENCOUNTER — Encounter: Payer: Self-pay | Admitting: Family Medicine

## 2023-04-18 ENCOUNTER — Ambulatory Visit: Payer: 59 | Admitting: Family Medicine

## 2023-04-18 VITALS — BP 118/74 | HR 93 | Ht 64.0 in | Wt 168.2 lb

## 2023-04-18 DIAGNOSIS — G43001 Migraine without aura, not intractable, with status migrainosus: Secondary | ICD-10-CM

## 2023-04-18 DIAGNOSIS — L4050 Arthropathic psoriasis, unspecified: Secondary | ICD-10-CM | POA: Diagnosis not present

## 2023-04-18 DIAGNOSIS — M79629 Pain in unspecified upper arm: Secondary | ICD-10-CM | POA: Insufficient documentation

## 2023-04-18 DIAGNOSIS — M4727 Other spondylosis with radiculopathy, lumbosacral region: Secondary | ICD-10-CM

## 2023-04-18 MED ORDER — GABAPENTIN 100 MG PO CAPS: 100.0000 mg | ORAL_CAPSULE | Freq: Every evening | ORAL | 0 refills | Status: DC | PRN

## 2023-04-18 MED ORDER — UBRELVY 100 MG PO TABS: 100.0000 mg | ORAL_TABLET | Freq: Every day | ORAL | 2 refills | Status: AC | PRN

## 2023-04-18 NOTE — Patient Instructions (Signed)
VISIT SUMMARY:  During today's visit, we discussed your ongoing health concerns, including migraines, low back pain, psoriatic arthritis, and a new issue with armpit pain. We reviewed your current medications and their effectiveness, and we talked about potential next steps for your treatment plan.  YOUR PLAN:  -MIGRAINES:  Please continue using Ubrelvy as you have been.  -CHRONIC LOW BACK PAIN: Please continue taking Gabapentin as needed, and we have refilled your prescription for three months. Additionally, please discuss potential treatments for psoriatic arthritis, including methotrexate, with your rheumatologist.  -UNDIFFERENTIATED INFLAMMATORY ARTHRITIS: You have concerns about starting methotrexate due to its impact on your immune system. We encourage you to consider starting methotrexate before your next rheumatology appointment on January 12th, 2025.  -UNEXPLAINED ARMPIT PAIN: You have a chronic burning pain in your armpit without visible skin changes. This could be due to conditions like folliculitis or Hidradenitis Suppurativa (HS) (see information attached). We are referring you to dermatology for further evaluation and will send you information on HS for your review.  -WEIGHT LOSS: You have lost a significant amount of weight and are close to your goal weight. You are on a weight loss medication and plan to discontinue it once you reach your goal. Please continue with your current weight loss plan.  INSTRUCTIONS:  Please follow up in three months or sooner if you need medication refills. Additionally, consider starting methotrexate before your next rheumatology appointment on January 12th, 2025, and attend the dermatology referral for your armpit pain.

## 2023-04-18 NOTE — Assessment & Plan Note (Signed)
She also reports a burning sensation in her armpit area, acute on chronic. She describes it as a persistent discomfort that does not appear to be related to her menstrual cycle or any visible skin changes. She has noticed some skin discoloration in other areas, including her neck, hands, and feet.     Document Information  Photos  Left axillary  04/18/2023 14:47  Attached To:  Office Visit on 04/18/23 with Jerrol Banana, MD  Source Information  Jerrol Banana, MD  [facility]  Document History      Document Information  Photos  Right axillary  04/18/2023 14:48  Attached To:  Office Visit on 04/18/23 with Jerrol Banana, MD  Source Information  Jerrol Banana, MD  [facility]  Document History     Chronic bilateral intermittent axillary pain Chronic burning pain in armpit, no visible skin changes or correlation with menstrual cycle. Possible folliculitis or Hidradenitis Suppurativa (HS) flares. -Refer to dermatology for further evaluation. -Send patient information on HS for review.

## 2023-04-18 NOTE — Progress Notes (Signed)
Primary Care / Sports Medicine Office Visit  Patient Information:  Patient ID: Alison Logan, female DOB: 11-Mar-1976 Age: 47 y.o. MRN: [id number]   Alison Logan is a pleasant 47 y.o. female presenting with the following:  Chief Complaint  Patient presents with   Cyst    Patient has knot under her left armpit. She has noticed this on and off for the last 3 months. She says it comes and goes and she also has a burning sensation occasionally.   Medication Refill    Gabapentin and Ubrevly    Vitals:   04/18/23 1419  BP: 118/74  Pulse: 93  SpO2: 98%   Vitals:   04/18/23 1419  Weight: 168 lb 3.2 oz (76.3 kg)  Height: 5\' 4"  (1.626 m)   Body mass index is 28.87 kg/m.  No results found.   Independent interpretation of notes and tests performed by another provider:   None  Procedures performed:   None  Pertinent History, Exam, Impression, and Recommendations:   Problem List Items Addressed This Visit       Cardiovascular and Mediastinum   Migraine - Primary     She has been managing her migraines with Bernita Raisin, which she takes as needed, approximately once or twice a month. She describes this regimen as providing excellent control of her migraines.  Migraines Well controlled with Bernita Raisin as needed, approximately 1-2 times per month. -Continue Ubrelvy as needed.      Relevant Medications   UBRELVY 100 MG TABS   gabapentin (NEURONTIN) 100 MG capsule     Nervous and Auditory   Lumbosacral spondylosis with radiculopathy (Chronic)    For her low back pain, she has been taking gabapentin. She reports that this medication helps with general pain, particularly related to her psoriatic arthritis. She takes it two to three times a week, mainly when she anticipates overexertion. She describes the medication as helping her  Chronic Low Back Pain Gabapentin providing relief for generalized pain, particularly when overexerting. Patient also diagnosed with psoriatic  arthritis by rheumatologist. -Continue Gabapentin as needed, refill prescription for three months. -Encourage patient to discuss with rheumatologist potential treatments for psoriatic arthritis, including methotrexate as was prescribed by their group.      Relevant Medications   gabapentin (NEURONTIN) 100 MG capsule     Musculoskeletal and Integument   Psoriatic arthritis (HCC)    Her psoriatic arthritis has been a significant concern. She reports increasing pain and stiffness, to the point where she feels she needs a wheelchair after a day at a theme park. She has been prescribed methotrexate by a rheumatologist but has not started taking it due to concerns about its impact on her immune system. She has sought alternative treatments, including reflex exercises, but has not found significant relief.  Inflammatory Arthritis Patient hesitant to start methotrexate due to concerns about immune system suppression. Patient has seen an alternative medicine practitioner but has not started any new treatments. -Encourage patient to consider starting methotrexate before next rheumatology appointment on January 12th, 2025.        Other   Pain in axilla (Chronic)    She also reports a burning sensation in her armpit area, acute on chronic. She describes it as a persistent discomfort that does not appear to be related to her menstrual cycle or any visible skin changes. She has noticed some skin discoloration in other areas, including her neck, hands, and feet.     Document Information  Photos  Left axillary  04/18/2023 14:47  Attached To:  Office Visit on 04/18/23 with Jerrol Banana, MD  Source Information  Jerrol Banana, MD  [facility]  Document History      Document Information  Photos  Right axillary  04/18/2023 14:48  Attached To:  Office Visit on 04/18/23 with Jerrol Banana, MD  Source Information  Jerrol Banana, MD  [facility]  Document  History     Chronic bilateral intermittent axillary pain Chronic burning pain in armpit, no visible skin changes or correlation with menstrual cycle. Possible folliculitis or Hidradenitis Suppurativa (HS) flares. -Refer to dermatology for further evaluation. -Send patient information on HS for review.      Relevant Orders   Ambulatory referral to Dermatology   I provided a total time of 50 minutes including both face-to-face and non-face-to-face time on 04/18/2023 inclusive of time utilized for medical chart review, information gathering, care coordination with staff, and documentation completion.   Orders & Medications Medications:  Meds ordered this encounter  Medications   UBRELVY 100 MG TABS    Sig: Take 1 tablet (100 mg total) by mouth daily as needed.    Dispense:  30 tablet    Refill:  2   gabapentin (NEURONTIN) 100 MG capsule    Sig: Take 1-2 capsules (100-200 mg total) by mouth at bedtime as needed.    Dispense:  90 capsule    Refill:  0   Orders Placed This Encounter  Procedures   Ambulatory referral to Dermatology     No follow-ups on file.     Jerrol Banana, MD, [facility]   Primary Care Sports Medicine Primary Care and Sports Medicine at [facility]

## 2023-04-18 NOTE — Assessment & Plan Note (Signed)
Her psoriatic arthritis has been a significant concern. She reports increasing pain and stiffness, to the point where she feels she needs a wheelchair after a day at a theme park. She has been prescribed methotrexate by a rheumatologist but has not started taking it due to concerns about its impact on her immune system. She has sought alternative treatments, including reflex exercises, but has not found significant relief.  Inflammatory Arthritis Patient hesitant to start methotrexate due to concerns about immune system suppression. Patient has seen an alternative medicine practitioner but has not started any new treatments. -Encourage patient to consider starting methotrexate before next rheumatology appointment on January 12th, 2025.

## 2023-04-18 NOTE — Assessment & Plan Note (Signed)
For her low back pain, she has been taking gabapentin. She reports that this medication helps with general pain, particularly related to her psoriatic arthritis. She takes it two to three times a week, mainly when she anticipates overexertion. She describes the medication as helping her   Chronic Low Back Pain Gabapentin providing relief for generalized pain, particularly when overexerting. Patient also diagnosed with psoriatic arthritis by rheumatologist. -Continue Gabapentin as needed, refill prescription for three months. -Encourage patient to discuss with rheumatologist potential treatments for psoriatic arthritis, including methotrexate as was prescribed by their group.

## 2023-04-18 NOTE — Assessment & Plan Note (Signed)
She has been managing her migraines with Bernita Raisin, which she takes as needed, approximately once or twice a month. She describes this regimen as providing excellent control of her migraines.   Migraines Well controlled with Bernita Raisin as needed, approximately 1-2 times per month. -Continue Ubrelvy as needed.

## 2023-04-21 DIAGNOSIS — L400 Psoriasis vulgaris: Secondary | ICD-10-CM | POA: Diagnosis not present

## 2023-04-21 DIAGNOSIS — L720 Epidermal cyst: Secondary | ICD-10-CM | POA: Diagnosis not present

## 2023-05-13 ENCOUNTER — Other Ambulatory Visit: Payer: Self-pay | Admitting: Family Medicine

## 2023-05-13 DIAGNOSIS — Z7689 Persons encountering health services in other specified circumstances: Secondary | ICD-10-CM

## 2023-05-17 NOTE — Telephone Encounter (Signed)
 Requested Prescriptions  Refused Prescriptions Disp Refills   WEGOVY  1 MG/0.5ML SOAJ [Pharmacy Med Name: WEGOVY  1 MG/0.5 ML PEN]  2    Sig: INJECT 1MG  INTO THE SKIN ONCE A WEEK     Endocrinology:  Diabetes - GLP-1 Receptor Agonists - semaglutide  Failed - 05/17/2023  8:39 AM      Failed - HBA1C in normal range and within 180 days    No results found for: HGBA1C, LABA1C       Failed - Cr in normal range and within 360 days    Creatinine, Ser  Date Value Ref Range Status  11/08/2022 1.02 (H) 0.57 - 1.00 mg/dL Final         Passed - Valid encounter within last 6 months    Recent Outpatient Visits           4 weeks ago Migraine without aura and with status migrainosus, not intractable   [place] [facility] Alvia, Selinda PARAS, MD   3 months ago Constipation, unspecified constipation type   [facility] at [facility] Alvia, Selinda PARAS, MD   6 months ago Encounter for weight management   [place] [facility] at [facility] Alvia, Selinda PARAS, MD   8 months ago Healthcare maintenance   [facility] Care & Sports Medicine at [facility], Selinda PARAS, MD   11 months ago Viral URI with cough   [facility] & [facility] Medicine at [facility], Selinda PARAS, MD

## 2023-06-16 DIAGNOSIS — Z79899 Other long term (current) drug therapy: Secondary | ICD-10-CM | POA: Diagnosis not present

## 2023-06-16 DIAGNOSIS — R32 Unspecified urinary incontinence: Secondary | ICD-10-CM | POA: Diagnosis not present

## 2023-06-16 DIAGNOSIS — L4050 Arthropathic psoriasis, unspecified: Secondary | ICD-10-CM | POA: Diagnosis not present

## 2023-06-16 DIAGNOSIS — E890 Postprocedural hypothyroidism: Secondary | ICD-10-CM | POA: Diagnosis not present

## 2023-07-04 ENCOUNTER — Ambulatory Visit: Payer: 59 | Admitting: Urology

## 2023-07-04 VITALS — BP 108/72 | HR 65

## 2023-07-04 DIAGNOSIS — N820 Vesicovaginal fistula: Secondary | ICD-10-CM | POA: Diagnosis not present

## 2023-07-04 DIAGNOSIS — R32 Unspecified urinary incontinence: Secondary | ICD-10-CM | POA: Diagnosis not present

## 2023-07-04 DIAGNOSIS — N393 Stress incontinence (female) (male): Secondary | ICD-10-CM

## 2023-07-04 DIAGNOSIS — O99893 Other specified diseases and conditions complicating puerperium: Secondary | ICD-10-CM | POA: Diagnosis not present

## 2023-07-04 LAB — MICROSCOPIC EXAMINATION

## 2023-07-04 LAB — URINALYSIS, COMPLETE
Bilirubin, UA: NEGATIVE
Glucose, UA: NEGATIVE
Ketones, UA: NEGATIVE
Nitrite, UA: NEGATIVE
Protein,UA: NEGATIVE
RBC, UA: NEGATIVE
Specific Gravity, UA: 1.025 (ref 1.005–1.030)
Urobilinogen, Ur: 0.2 mg/dL (ref 0.2–1.0)
pH, UA: 5.5 (ref 5.0–7.5)

## 2023-07-04 NOTE — Progress Notes (Signed)
 07/04/2023 11:07 AM   Alison Logan 09-11-1975 [id number]  Referring provider: Defoor, Dionne Ano, PA-C [address] [place],  [place] [address]  No chief complaint on file.   HPI: I was consulted to assess the patient is urinary incontinence.  With weight loss in the last year she no longer was leaking with coughing and sneezing but now does.  She leaks more walking upstairs.  I think sometimes she leaks a small amount standing but then she said she does not leak without awareness.  She denies urge incontinence and bedwetting  She voids every 2 hours.  She gets up at least twice a night and has no ankle edema  Flow is moderately strong.  She does not always feel empty  She has got lumbar disc disease.  No hysterectomy  She has had a kidney stone.  No previous bladder surgery.  No history of recurrent infections.  No neurologic issues.  No treatment   PMH: Past Medical History:  Diagnosis Date   Allergy    Anxiety    Asthma    Migraines    Ovarian cyst    Sleep apnea    no CPAP   Thyroid disease     Surgical History: Past Surgical History:  Procedure Laterality Date   COLONOSCOPY WITH PROPOFOL N/A 06/26/2021   Procedure: COLONOSCOPY WITH BIOPSY;  Surgeon: Midge Minium, MD;  Location: [facility];  Service: Endoscopy;  Laterality: N/A;   LAPAROSCOPY  1997   NASAL SINUS SURGERY  2018   POLYPECTOMY N/A 06/26/2021   Procedure: POLYPECTOMY;  Surgeon: Midge Minium, MD;  Location: [facility];  Service: Endoscopy;  Laterality: N/A;   RHINOPLASTY  2018   THYROIDECTOMY  2012   WISDOM TOOTH EXTRACTION Bilateral 1996    Home Medications:  Allergies as of 07/04/2023       Reactions   Aspartame Other (See Comments)   Headaches   Tree Extract Anaphylaxis   Tree nuts        Medication List        Accurate as of July 04, 2023 11:07 AM. If you have any questions, ask your nurse or doctor.          albuterol 108 (90 Base) MCG/ACT  inhaler Commonly known as: VENTOLIN HFA Inhale 2 puffs into the lungs every 6 (six) hours as needed for wheezing or shortness of breath.   gabapentin 100 MG capsule Commonly known as: NEURONTIN Take 1-2 capsules (100-200 mg total) by mouth at bedtime as needed.   levothyroxine 88 MCG tablet Commonly known as: SYNTHROID Take 88 mcg by mouth daily.   liothyronine 5 MCG tablet Commonly known as: CYTOMEL Take 5 mcg by mouth daily.   multivitamin tablet Take 1 tablet by mouth daily.   Ubrelvy 100 MG Tabs Generic drug: Ubrogepant Take 1 tablet (100 mg total) by mouth daily as needed.   vitamin C 1000 MG tablet Take 1,000 mg by mouth daily.   Wegovy 1 MG/0.5ML Soaj Generic drug: Semaglutide-Weight Management Inject 1 mg into the skin once a week.        Allergies:  Allergies  Allergen Reactions   Aspartame Other (See Comments)    Headaches   Tree Extract Anaphylaxis    Tree nuts    Family History: Family History  Problem Relation Age of Onset   Uterine cancer Mother    Pancreatic cancer Father    Celiac disease Son    Stomach cancer Maternal Aunt  Pancreatic cancer Maternal Uncle    Uterine cancer Maternal Grandmother    Colon cancer Paternal Grandfather     Social History:  reports that she quit smoking about 2 years ago. Her smoking use included cigarettes. She started smoking about 10 years ago. She has a 4 pack-year smoking history. She has never used smokeless tobacco. She reports current alcohol use of about 2.0 standard drinks of alcohol per week. She reports that she does not use drugs.  ROS:                                        Physical Exam: There were no vitals taken for this visit.  Constitutional:  Alert and oriented, No acute distress. HEENT: [place] AT, moist mucus membranes.  Trachea midline, no masses. Cardiovascular: No clubbing, cyanosis, or edema. Respiratory: Normal respiratory effort, no increased work of  breathing. GI: Abdomen is soft, nontender, nondistended, no abdominal masses GU: Grade 1 hypermobility the bladder neck and negative cough test with a light cough.  No prolapse Skin: No rashes, bruises or suspicious lesions. Lymph: No cervical or inguinal adenopathy. Neurologic: Grossly intact, no focal deficits, moving all 4 extremities. Psychiatric: Normal mood and affect.  Laboratory Data: Lab Results  Component Value Date   WBC 6.9 08/26/2022   HGB 15.6 08/26/2022   HCT 45.7 08/26/2022   MCV 94 08/26/2022   PLT 335 08/26/2022    Lab Results  Component Value Date   CREATININE 1.02 (H) 11/08/2022    No results found for: "PSA"  No results found for: "TESTOSTERONE"  No results found for: "HGBA1C"  Urinalysis    Component Value Date/Time   COLORURINE YELLOW 04/24/2020 1628   APPEARANCEUR HAZY (A) 04/24/2020 1628   LABSPEC >1.030 (H) 04/24/2020 1628   PHURINE 5.0 04/24/2020 1628   GLUCOSEU NEGATIVE 04/24/2020 1628   HGBUR NEGATIVE 04/24/2020 1628   BILIRUBINUR NEGATIVE 04/24/2020 1628   KETONESUR TRACE (A) 04/24/2020 1628   PROTEINUR NEGATIVE 04/24/2020 1628   NITRITE NEGATIVE 04/24/2020 1628   LEUKOCYTESUR NEGATIVE 04/24/2020 1628    Pertinent Imaging: Urine reviewed and sent for culture.  Chart reviewed.  Assessment & Plan: Patient has mild stress incontinence.  Role of physical therapy and urodynamics and cystoscopy discussed.  Call if culture positive.  Patient chose physical therapy.  I will check on her in 6 months  There are no diagnoses linked to this encounter.  No follow-ups on file.  Martina Sinner, MD  [facility] [address] [place], [place] [address] [phone]

## 2023-07-07 LAB — CULTURE, URINE COMPREHENSIVE

## 2023-07-08 ENCOUNTER — Encounter: Payer: Self-pay | Admitting: Urology

## 2023-07-08 NOTE — Telephone Encounter (Signed)
 Patient called back and was advised. Patient will call back if she decides to proceed with urodynamics

## 2023-07-11 ENCOUNTER — Other Ambulatory Visit: Payer: Self-pay | Admitting: Family Medicine

## 2023-07-11 DIAGNOSIS — Z7689 Persons encountering health services in other specified circumstances: Secondary | ICD-10-CM

## 2023-07-12 NOTE — Telephone Encounter (Signed)
 Requested medication (s) are due for refill today:   Yes  Requested medication (s) are on the active medication list:   Yes  Future visit scheduled:   No    LOV 01/24/2023 with Dr. Ashley Royalty   Last ordered: 01/24/2023 2 ml, 2 refills  Unable to refill because labs are due.     Requested Prescriptions  Pending Prescriptions Disp Refills   WEGOVY 1 MG/0.5ML SOAJ [Pharmacy Med Name: WEGOVY 1 MG/0.5 ML PEN]  2    Sig: INJECT 1MG  INTO THE SKIN ONCE A WEEK     Endocrinology:  Diabetes - GLP-1 Receptor Agonists - semaglutide Failed - 07/12/2023 10:50 AM      Failed - HBA1C in normal range and within 180 days    No results found for: "HGBA1C", "LABA1C"       Failed - Cr in normal range and within 360 days    Creatinine, Ser  Date Value Ref Range Status  11/08/2022 1.02 (H) 0.57 - 1.00 mg/dL Final         Passed - Valid encounter within last 6 months    Recent Outpatient Visits           2 months ago Migraine without aura and with status migrainosus, not intractable   [place] [facility] Ashley Royalty, Ocie Bob, MD   5 months ago Constipation, unspecified constipation type   [facility], Ocie Bob, MD   8 months ago Encounter for weight management   [place] [facility], Ocie Bob, MD   10 months ago Healthcare maintenance   [facility], Ocie Bob, MD   1 year ago Viral URI with cough   [facility] Primary Care & Sports Medicine at [facility], Ocie Bob, MD       Future Appointments             In 5 months MacDiarmid, Lorin Picket, MD [facility]

## 2023-07-12 NOTE — Telephone Encounter (Signed)
 Med refill

## 2023-07-13 ENCOUNTER — Encounter: Payer: Self-pay | Admitting: Family Medicine

## 2023-07-14 ENCOUNTER — Encounter: Payer: Self-pay | Admitting: Family Medicine

## 2023-07-14 ENCOUNTER — Other Ambulatory Visit: Payer: Self-pay | Admitting: Family Medicine

## 2023-07-14 ENCOUNTER — Ambulatory Visit: Admitting: Family Medicine

## 2023-07-14 VITALS — BP 118/80 | HR 87 | Ht 64.0 in | Wt 178.0 lb

## 2023-07-14 DIAGNOSIS — S29012A Strain of muscle and tendon of back wall of thorax, initial encounter: Secondary | ICD-10-CM | POA: Diagnosis not present

## 2023-07-14 DIAGNOSIS — R079 Chest pain, unspecified: Secondary | ICD-10-CM

## 2023-07-14 DIAGNOSIS — Z7689 Persons encountering health services in other specified circumstances: Secondary | ICD-10-CM

## 2023-07-14 MED ORDER — CELECOXIB 100 MG PO CAPS: 100.0000 mg | ORAL_CAPSULE | Freq: Two times a day (BID) | ORAL | 0 refills | Status: DC

## 2023-07-14 MED ORDER — TIRZEPATIDE-WEIGHT MANAGEMENT 5 MG/0.5ML ~~LOC~~ SOLN: 5.0000 mg | SUBCUTANEOUS | 0 refills | Status: DC

## 2023-07-14 MED ORDER — CYCLOBENZAPRINE HCL 5 MG PO TABS: 5.0000 mg | ORAL_TABLET | Freq: Three times a day (TID) | ORAL | 0 refills | Status: DC | PRN

## 2023-07-14 NOTE — Progress Notes (Signed)
 Primary Care / Sports Medicine Office Visit  Patient Information:  Patient ID: Alison Logan, female DOB: Nov 24, 1975 Age: 48 y.o. MRN: [id number]   Alison Logan is a pleasant 48 y.o. female presenting with the following:  Chief Complaint  Patient presents with   Chest Pain    Right sided rib pain x 1 week. Pain wraps around to the front and back. She has not taken any medication for the pain. Pain with breathing and tender to touch. NKI.     Vitals:   07/14/23 0807  BP: 118/80  Pulse: 87  SpO2: 97%   Vitals:   07/14/23 0807  Weight: 178 lb (80.7 kg)  Height: 5\' 4"  (1.626 m)   Body mass index is 30.55 kg/m.  No results found.   Independent interpretation of notes and tests performed by another provider:   None  Procedures performed:   None  Pertinent History, Exam, Impression, and Recommendations:   Problem List Items Addressed This Visit     Encounter for weight management   Weight Management Patient has gained some weight back and has not found success with maintenance semaglutide/Wegovy shots. -Patient amenable to trial of tirzepatide, starting at. -Schedule follow-up in two months to review.      Relevant Medications   tirzepatide 5 MG/0.5ML injection vial   Strain of thoracic back region - Primary   History of Present Illness Alison Logan is a 48 year old female with psoriatic arthritis who presents with acute onset right-sided thoracic pain x 1 week.  She has been experiencing a 'weird' pain on the right side of her thoracic region for the past week. Initially, she thought it might be a pulled muscle due to moving items at work. There was no bruising initially, but later a purple discoloration appeared when lifting her arm, which her husband also noticed. The pain has since wrapped around her back and under her breast, stopping short of crossing the midline. It is worse in the back than in the front and is exacerbated by deep breaths,  sitting, and certain arm movements. She cannot sleep on the affected side due to discomfort. Ibuprofen taken a few days ago did not alleviate the pain, and no other treatments have been used.  She has a history of psoriatic arthritis and has been prescribed Enbrel, but has not started it yet due to pending shipment arrangements. She previously tried methotrexate, which caused adverse effects and is not currently dosing that medication.   No new onset of fatigue or feeling run down. No numbness or tingling in her arm.  Physical Exam INSPECTION: No abnormalities, lesions, rashes, or skin discoloration noted along the hemithorax, PALPATION: Mild tenderness at inferior scapular border on right and midline axillary region.  Tenderness on rib palpation.  Asymptomatic with light touch throughout the described affected region. SPECIAL TESTS: Provocative testing localizes to rhomboids and serratus anterior.  Assessment and Plan Thoracic muscular strain Pain localized to the right mid to lower thoracic region, worse in the back than the front. No skin discoloration or lesions. Provocative testing localizes to the rhomboids and serratus anterior. No signs of shingles or rib pathology. -Continue previously prescribed gabapentin as needed for nerve pain. -Prescribe Celebrex for anti-inflammatory effect, does this twice daily x 1 week then twice daily as needed.  Take with food. -Prescribe Flexeril for muscle relaxation, especially at night.  Side effect and be drowsiness. -Advise trial use of Voltaren gel and Salonpas with lidocaine for  topical relief. -Advise patient to monitor for signs of shingles and to contact the office if any skin changes occur.  If noted, send photograph of any rashes.      Relevant Medications   celecoxib (CELEBREX) 100 MG capsule   cyclobenzaprine (FLEXERIL) 5 MG tablet     Orders & Medications Medications:  Meds ordered this encounter  Medications   celecoxib (CELEBREX)  100 MG capsule    Sig: Take 1 capsule (100 mg total) by mouth 2 (two) times daily. X 1 week then BID PRN    Dispense:  60 capsule    Refill:  0   cyclobenzaprine (FLEXERIL) 5 MG tablet    Sig: Take 1-2 tablets (5-10 mg total) by mouth 3 (three) times daily as needed for muscle spasms.    Dispense:  30 tablet    Refill:  0   tirzepatide 5 MG/0.5ML injection vial    Sig: Inject 5 mg into the skin once a week.    Dispense:  2 mL    Refill:  0   No orders of the defined types were placed in this encounter.    Return in about 2 months (around 09/13/2023) for CPE.     Jerrol Banana, MD, [facility]   Primary Care Sports Medicine Primary Care and Sports Medicine at [facility]

## 2023-07-14 NOTE — Telephone Encounter (Signed)
 Requested medication (s) are due for refill today - no  Requested medication (s) are on the active medication list -yes  Future visit scheduled -yes  Last refill: 07/14/23  Notes to clinic:   Pharmacy comment: Alternative Requested:[facility]. PLEASE CHANGE OR HAVE PATIENT PAY OUT OF POCKET.    Requested Prescriptions  Pending Prescriptions Disp Refills   ZEPBOUND 5 MG/0.5ML injection vial [Pharmacy Med Name: ZEPBOUND 5 MG/0.5 ML VIAL] 2 mL 0    Sig: INJECT 5 MG SUBCUTANEOUSLY WEEKLY     Off-Protocol Failed - 07/14/2023  2:21 PM      Failed - Medication not assigned to a protocol, review manually.      Passed - Valid encounter within last 12 months    Recent Outpatient Visits           2 months ago Migraine without aura and with status migrainosus, not intractable   [place] [facility], Ocie Bob, MD   5 months ago Constipation, unspecified constipation type   [facility] at [facility], Ocie Bob, MD   8 months ago Encounter for weight management   [place] [facility], Ocie Bob, MD   10 months ago Healthcare maintenance   [facility], Ocie Bob, MD   1 year ago Viral URI with cough   [place] [facility], Ocie Bob, MD       Future Appointments             In 3 months Ashley Royalty, Ocie Bob, MD [facility] Medicine at [facility]   In 5 months Alfredo Martinez, MD [facility] [place]               Requested Prescriptions  Pending Prescriptions Disp Refills   ZEPBOUND 5 MG/0.5ML injection vial [Pharmacy Med Name: ZEPBOUND 5 MG/0.5 ML VIAL] 2 mL 0    Sig: INJECT 5 MG SUBCUTANEOUSLY WEEKLY     Off-Protocol Failed - 07/14/2023  2:21 PM      Failed - Medication not assigned  to a protocol, review manually.      Passed - Valid encounter within last 12 months    Recent Outpatient Visits           2 months ago Migraine without aura and with status migrainosus, not intractable   [place] [facility], Ocie Bob, MD   5 months ago Constipation, unspecified constipation type   [facility] at [facility], Ocie Bob, MD   8 months ago Encounter for weight management   [facility] at [facility], Ocie Bob, MD   10 months ago Healthcare maintenance   [facility] & [facility] Medicine at [facility], Ocie Bob, MD   1 year ago Viral URI with cough   [facility] Care & Sports Medicine at [facility], Ocie Bob, MD       Future Appointments             In 3 months Ashley Royalty, Ocie Bob, MD [facility] Primary Care & Sports Medicine at [facility]   In 5 months Alfredo Martinez, MD [facility]

## 2023-07-14 NOTE — Assessment & Plan Note (Signed)
 History of Present Illness Alison Logan is a 48 year old female with psoriatic arthritis who presents with acute onset right-sided thoracic pain x 1 week.  She has been experiencing a 'weird' pain on the right side of her thoracic region for the past week. Initially, she thought it might be a pulled muscle due to moving items at work. There was no bruising initially, but later a purple discoloration appeared when lifting her arm, which her husband also noticed. The pain has since wrapped around her back and under her breast, stopping short of crossing the midline. It is worse in the back than in the front and is exacerbated by deep breaths, sitting, and certain arm movements. She cannot sleep on the affected side due to discomfort. Ibuprofen taken a few days ago did not alleviate the pain, and no other treatments have been used.  She has a history of psoriatic arthritis and has been prescribed Enbrel, but has not started it yet due to pending shipment arrangements. She previously tried methotrexate, which caused adverse effects and is not currently dosing that medication.   No new onset of fatigue or feeling run down. No numbness or tingling in her arm.  Physical Exam INSPECTION: No abnormalities, lesions, rashes, or skin discoloration noted along the hemithorax, PALPATION: Mild tenderness at inferior scapular border on right and midline axillary region.  Tenderness on rib palpation.  Asymptomatic with light touch throughout the described affected region. SPECIAL TESTS: Provocative testing localizes to rhomboids and serratus anterior.  Assessment and Plan Thoracic muscular strain Pain localized to the right mid to lower thoracic region, worse in the back than the front. No skin discoloration or lesions. Provocative testing localizes to the rhomboids and serratus anterior. No signs of shingles or rib pathology. -Continue previously prescribed gabapentin as needed for nerve pain. -Prescribe  Celebrex for anti-inflammatory effect, does this twice daily x 1 week then twice daily as needed.  Take with food. -Prescribe Flexeril for muscle relaxation, especially at night.  Side effect and be drowsiness. -Advise trial use of Voltaren gel and Salonpas with lidocaine for topical relief. -Advise patient to monitor for signs of shingles and to contact the office if any skin changes occur.  If noted, send photograph of any rashes.

## 2023-07-14 NOTE — Assessment & Plan Note (Signed)
 Weight Management Patient has gained some weight back and has not found success with maintenance semaglutide/Wegovy shots. -Patient amenable to trial of tirzepatide, starting at. -Schedule follow-up in two months to review.

## 2023-07-14 NOTE — Patient Instructions (Signed)
 Patient Plan for [employer]  1. Weight Management    - Begin tirzepatide injections as prescribed.    - Schedule a follow-up appointment in two months to review progress at your physical.  2. Thoracic Muscular Strain    - Continue using gabapentin as needed for nerve pain.    - Take Celebrex twice daily for one week, then as needed. Always take with food.    - Use Flexeril for muscle relaxation, especially at night. Be aware it may cause drowsiness.    - Try Voltaren gel and Salonpas with lidocaine for topical pain relief.    - Start exercises as shown below.  Red Flags to Monitor: - Watch for any skin changes that might indicate shingles. If you notice a rash or discoloration, take a photo and contact the office immediately.

## 2023-07-25 ENCOUNTER — Encounter: Payer: Self-pay | Admitting: Family Medicine

## 2023-07-25 NOTE — Telephone Encounter (Signed)
 I spoke with patient and she does not know why we received refill request.

## 2023-07-25 NOTE — Telephone Encounter (Signed)
 Please review.  Thank you,  Alison Logan

## 2023-07-25 NOTE — Telephone Encounter (Signed)
 Pt response.  KP

## 2023-07-26 ENCOUNTER — Other Ambulatory Visit: Payer: Self-pay | Admitting: Family Medicine

## 2023-07-26 DIAGNOSIS — M4727 Other spondylosis with radiculopathy, lumbosacral region: Secondary | ICD-10-CM

## 2023-07-26 NOTE — Telephone Encounter (Signed)
 Pt response

## 2023-07-26 NOTE — Telephone Encounter (Signed)
 Pt response.    JM

## 2023-07-28 NOTE — Telephone Encounter (Signed)
 Requested by interface surescripts. Future visit in 3 months. No refills remain Requested Prescriptions  Pending Prescriptions Disp Refills   gabapentin (NEURONTIN) 100 MG capsule [Pharmacy Med Name: GABAPENTIN 100 MG CAPSULE] 90 capsule 0    Sig: TAKE 1-2 CAPSULES (100-200 MG TOTAL) BY MOUTH AT BEDTIME AS NEEDED.     Neurology: Anticonvulsants - gabapentin Failed - 07/28/2023  8:04 AM      Failed - Cr in normal range and within 360 days    Creatinine, Ser  Date Value Ref Range Status  11/08/2022 1.02 (H) 0.57 - 1.00 mg/dL Final         Passed - Completed PHQ-2 or PHQ-9 in the last 360 days      Passed - Valid encounter within last 12 months    Recent Outpatient Visits           3 months ago Migraine without aura and with status migrainosus, not intractable   [place] [facility], Ocie Bob, MD   6 months ago Constipation, unspecified constipation type   [facility] at [facility], Ocie Bob, MD   8 months ago Encounter for weight management   [place] [facility], Ocie Bob, MD   11 months ago Healthcare maintenance   [facility] at [facility], Ocie Bob, MD   1 year ago Viral URI with cough   [facility] at [facility], Ocie Bob, MD       Future Appointments             In 3 months Ashley Royalty, Ocie Bob, MD [facility] at [facility]   In 5 months Alfredo Martinez, MD [facility]rology [facility]

## 2023-07-29 ENCOUNTER — Other Ambulatory Visit: Payer: Self-pay | Admitting: Family Medicine

## 2023-07-29 MED ORDER — TIRZEPATIDE-WEIGHT MANAGEMENT 2.5 MG/0.5ML ~~LOC~~ SOLN: 2.5000 mg | SUBCUTANEOUS | 2 refills | Status: DC

## 2023-08-12 ENCOUNTER — Other Ambulatory Visit: Payer: Self-pay | Admitting: Family Medicine

## 2023-08-12 DIAGNOSIS — S29012A Strain of muscle and tendon of back wall of thorax, initial encounter: Secondary | ICD-10-CM

## 2023-08-12 NOTE — Telephone Encounter (Signed)
 Requested Prescriptions  Pending Prescriptions Disp Refills   celecoxib (CELEBREX) 100 MG capsule [Pharmacy Med Name: CELECOXIB 100 MG CAPSULE] 60 capsule 0    Sig: TAKE 1 CAPSULE (100 MG TOTAL) BY MOUTH 2 (TWO) TIMES DAILY. X 1 WEEK THEN TWICE A DAY AS NEEDED     Analgesics:  COX2 Inhibitors Failed - 08/12/2023  2:07 PM      Failed - Manual Review: Labs are only required if the patient has taken medication for more than 8 weeks.      Failed - Cr in normal range and within 360 days    Creatinine, Ser  Date Value Ref Range Status  11/08/2022 1.02 (H) 0.57 - 1.00 mg/dL Final         Failed - Valid encounter within last 12 months    Recent Outpatient Visits           4 weeks ago Strain of thoracic back region   [facility] at [facility], Ocie Bob, MD       Future Appointments             In 2 months Ashley Royalty, Ocie Bob, MD [facility] at [facility]   In 4 months MacDiarmid, Lorin Picket, MD [facility]rology [place]            Passed - HGB in normal range and within 360 days    Hemoglobin  Date Value Ref Range Status  08/26/2022 15.6 11.1 - 15.9 g/dL Final         Passed - HCT in normal range and within 360 days    Hematocrit  Date Value Ref Range Status  08/26/2022 45.7 34.0 - 46.6 % Final         Passed - AST in normal range and within 360 days    AST  Date Value Ref Range Status  08/26/2022 21 0 - 40 IU/L Final         Passed - ALT in normal range and within 360 days    ALT  Date Value Ref Range Status  08/26/2022 20 0 - 32 IU/L Final         Passed - eGFR is 30 or above and within 360 days    GFR calc Af Amer  Date Value Ref Range Status  03/30/2018 >60 >60 mL/min Final    Comment:    (NOTE) The eGFR has been calculated using the CKD EPI equation. This calculation has not been validated in all clinical situations. eGFR's persistently <60 mL/min signify possible Chronic  Kidney Disease.    GFR calc non Af Amer  Date Value Ref Range Status  03/30/2018 >60 >60 mL/min Final   eGFR  Date Value Ref Range Status  11/08/2022 69 >59 mL/min/1.73 Final         Passed - Patient is not pregnant

## 2023-08-18 NOTE — Telephone Encounter (Signed)
 Please review

## 2023-08-18 NOTE — Telephone Encounter (Signed)
 Forms not received at this time.  KP

## 2023-08-18 NOTE — Telephone Encounter (Signed)
 Please review 2nd message.  KP

## 2023-08-29 ENCOUNTER — Encounter: Payer: 59 | Admitting: Family Medicine

## 2023-09-06 NOTE — Telephone Encounter (Signed)
 Please review.  KP

## 2023-09-08 NOTE — Telephone Encounter (Signed)
 Please review.  KP

## 2023-10-07 ENCOUNTER — Other Ambulatory Visit: Payer: Self-pay

## 2023-10-07 DIAGNOSIS — Z7689 Persons encountering health services in other specified circumstances: Secondary | ICD-10-CM

## 2023-10-07 MED ORDER — WEGOVY 1 MG/0.5ML ~~LOC~~ SOAJ: 1.0000 mg | SUBCUTANEOUS | 2 refills | Status: DC

## 2023-10-10 NOTE — Telephone Encounter (Signed)
Please review patient's message:

## 2023-10-13 ENCOUNTER — Encounter: Payer: Self-pay | Admitting: Family Medicine

## 2023-10-14 ENCOUNTER — Encounter: Payer: Self-pay | Admitting: Family Medicine

## 2023-10-14 ENCOUNTER — Ambulatory Visit: Admitting: Family Medicine

## 2023-10-14 VITALS — BP 102/74 | HR 78 | Ht 64.0 in | Wt 168.0 lb

## 2023-10-14 DIAGNOSIS — L03116 Cellulitis of left lower limb: Secondary | ICD-10-CM | POA: Diagnosis not present

## 2023-10-14 DIAGNOSIS — W57XXXA Bitten or stung by nonvenomous insect and other nonvenomous arthropods, initial encounter: Secondary | ICD-10-CM | POA: Diagnosis not present

## 2023-10-14 MED ORDER — DOXYCYCLINE HYCLATE 100 MG PO TABS: 100.0000 mg | ORAL_TABLET | Freq: Two times a day (BID) | ORAL | 0 refills | Status: AC

## 2023-10-14 NOTE — Progress Notes (Addendum)
   Acute Office Visit  Subjective:     Patient ID: STACIA FEAZELL, female    DOB: [birth year], 48 y.o.   MRN: [id number]  Chief Complaint  Patient presents with   Insect Bite    X2-3 days,Left leg, darker in color, itching, painful     48 year old female presents for unknown bug bite, happened 2 days ago.  She noticed bruising over the insect bite site yesterday.  Has some tenderness and bruising locally.  Denies itching.  She is concerned about bug bites since she is on immunosuppressants Enbrel. Denies fever, headache, chills, new rash, joint pains.        Review of Systems  All other systems reviewed and are negative.       Objective:    BP 102/74   Pulse 78   Ht 5\' 4"  (1.626 m)   Wt 168 lb (76.2 kg)   SpO2 97%   BMI 28.84 kg/m    Physical Exam Vitals and nursing note reviewed.  Constitutional:      Appearance: Normal appearance.  HENT:     Head: Normocephalic.     Right Ear: External ear normal.     Left Ear: External ear normal.  Eyes:     Conjunctiva/sclera: Conjunctivae normal.  Cardiovascular:     Rate and Rhythm: Normal rate.  Pulmonary:     Effort: Pulmonary effort is normal. No respiratory distress.  Abdominal:     Palpations: Abdomen is soft.  Musculoskeletal:        General: Normal range of motion.  Skin:    General: Skin is warm.     Findings: Lesion present.     Comments: Small red spot noted over left inner thigh, surrounded by bruising.  Size of a quarter.  Border is clear.  Neurological:     Mental Status: She is alert and oriented to person, place, and time.  Psychiatric:        Mood and Affect: Mood normal.     No results found for any visits on 10/14/23.      Assessment & Plan:   Problem List Items Addressed This Visit   None Visit Diagnoses       Bug bite, initial encounter    -  Primary   Relevant Medications   doxycycline  (VIBRA -TABS) 100 MG tablet   Other Relevant Orders   Tickborne Disease Antibody Profile,  Serum     Mild bruising/redness around the unknown insect bite, will do a course of doxycycline  and send a tickborne panel.  Anticipated guidance given.  Meds ordered this encounter  Medications   doxycycline  (VIBRA -TABS) 100 MG tablet    Sig: Take 1 tablet (100 mg total) by mouth 2 (two) times daily for 10 days.    Dispense:  20 tablet    Refill:  0    No follow-ups on file.  Vinary K Tascha Casares, MD

## 2023-10-14 NOTE — Telephone Encounter (Signed)
 Can you review and advise patient.  JM

## 2023-10-14 NOTE — Telephone Encounter (Signed)
 Recommend office visit.  Dr.K

## 2023-10-17 DIAGNOSIS — Z79899 Other long term (current) drug therapy: Secondary | ICD-10-CM | POA: Diagnosis not present

## 2023-10-17 DIAGNOSIS — L4050 Arthropathic psoriasis, unspecified: Secondary | ICD-10-CM | POA: Diagnosis not present

## 2023-10-18 LAB — TICKBORNE DISEASE ANTIBODY PROFILE, SERUM
Babesia microti IgG: <1:10 {titer}
E.Chaffeensis (HME) IgG: NEGATIVE
HGE IgG Titer: NEGATIVE
Lyme Total Antibody EIA: NEGATIVE

## 2023-10-19 ENCOUNTER — Ambulatory Visit: Payer: Self-pay | Admitting: Family Medicine

## 2023-10-19 NOTE — Telephone Encounter (Signed)
 Please review.  KP

## 2023-11-03 ENCOUNTER — Encounter: Admitting: Family Medicine

## 2023-11-13 ENCOUNTER — Other Ambulatory Visit: Payer: Self-pay | Admitting: Family Medicine

## 2023-11-13 DIAGNOSIS — S29012A Strain of muscle and tendon of back wall of thorax, initial encounter: Secondary | ICD-10-CM

## 2023-11-14 ENCOUNTER — Encounter: Admitting: Family Medicine

## 2023-11-15 NOTE — Telephone Encounter (Signed)
 Requested medication (s) are due for refill today: na   Requested medication (s) are on the active medication list: yes   Last refill:  08/12/23 #180 0 refills  Future visit scheduled: no  Notes to clinic:  tapered drug. No refills , do you want to refill Rx?     Requested Prescriptions  Pending Prescriptions Disp Refills   celecoxib  (CELEBREX ) 100 MG capsule [Pharmacy Med Name: CELECOXIB  100 MG CAPSULE] 180 capsule 0    Sig: TAKE 1 CAPSULE (100 MG TOTAL) BY MOUTH 2 (TWO) TIMES DAILY. X 1 WEEK THEN TWICE A DAY AS NEEDED     Analgesics:  COX2 Inhibitors Failed - 11/15/2023  2:57 PM      Failed - Manual Review: Labs are only required if the patient has taken medication for more than 8 weeks.      Failed - HGB in normal range and within 360 days    Hemoglobin  Date Value Ref Range Status  08/26/2022 15.6 11.1 - 15.9 g/dL Final         Failed - Cr in normal range and within 360 days    Creatinine, Ser  Date Value Ref Range Status  11/08/2022 1.02 (H) 0.57 - 1.00 mg/dL Final         Failed - HCT in normal range and within 360 days    Hematocrit  Date Value Ref Range Status  08/26/2022 45.7 34.0 - 46.6 % Final         Failed - AST in normal range and within 360 days    AST  Date Value Ref Range Status  08/26/2022 21 0 - 40 IU/L Final         Failed - ALT in normal range and within 360 days    ALT  Date Value Ref Range Status  08/26/2022 20 0 - 32 IU/L Final         Failed - eGFR is 30 or above and within 360 days    GFR calc Af Amer  Date Value Ref Range Status  03/30/2018 >60 >60 mL/min Final    Comment:    (NOTE) The eGFR has been calculated using the CKD EPI equation. This calculation has not been validated in all clinical situations. eGFR's persistently <60 mL/min signify possible Chronic Kidney Disease.    GFR calc non Af Amer  Date Value Ref Range Status  03/30/2018 >60 >60 mL/min Final   eGFR  Date Value Ref Range Status  11/08/2022 69 >59 mL/min/1.73  Final         Failed - Valid encounter within last 12 months    Recent Outpatient Visits           1 month ago Cellulitis of left lower extremity   [place] [facility] at [facility], Vinay K, MD   4 months ago Strain of thoracic back region   [facility] at [facility], Selinda PARAS, MD       Future Appointments             In 1 month MacDiarmid, Glendia, MD [facility] - Patient is not pregnant

## 2023-12-05 ENCOUNTER — Other Ambulatory Visit (HOSPITAL_COMMUNITY): Payer: Self-pay

## 2023-12-05 ENCOUNTER — Telehealth: Payer: Self-pay | Admitting: Pharmacy Technician

## 2023-12-05 NOTE — Telephone Encounter (Signed)
 Insurance companies are becoming increasingly stricter about requiring thorough documentation of lifestyle modifications in the patient's chart at each visit. This includes detailed records of diet recommendations (caloric intake, etc), exercise plans (amount of time/wk, etc), and an emphasis on the patient's commitment to continuing these efforts while on medication.  Without this additional documentation in the chart notes, a prior authorization will most likely be denied.   We also need a recent weight check as well (within the last 45 days)

## 2023-12-05 NOTE — Telephone Encounter (Signed)
 Please review PA team message.  JM

## 2023-12-06 NOTE — Telephone Encounter (Signed)
 I have scheduled patient.  JM

## 2023-12-06 NOTE — Telephone Encounter (Signed)
 Dr. Alvia this is perfect, as soon as I get the updated weight I can submit the PA. Thank you!

## 2023-12-19 DIAGNOSIS — Z124 Encounter for screening for malignant neoplasm of cervix: Secondary | ICD-10-CM | POA: Diagnosis not present

## 2023-12-19 DIAGNOSIS — R2232 Localized swelling, mass and lump, left upper limb: Secondary | ICD-10-CM | POA: Diagnosis not present

## 2023-12-19 DIAGNOSIS — Z1231 Encounter for screening mammogram for malignant neoplasm of breast: Secondary | ICD-10-CM | POA: Diagnosis not present

## 2023-12-19 DIAGNOSIS — Z01411 Encounter for gynecological examination (general) (routine) with abnormal findings: Secondary | ICD-10-CM | POA: Diagnosis not present

## 2023-12-19 DIAGNOSIS — E559 Vitamin D deficiency, unspecified: Secondary | ICD-10-CM | POA: Diagnosis not present

## 2023-12-19 DIAGNOSIS — Z1331 Encounter for screening for depression: Secondary | ICD-10-CM | POA: Diagnosis not present

## 2023-12-19 DIAGNOSIS — N920 Excessive and frequent menstruation with regular cycle: Secondary | ICD-10-CM | POA: Diagnosis not present

## 2023-12-19 DIAGNOSIS — N946 Dysmenorrhea, unspecified: Secondary | ICD-10-CM | POA: Diagnosis not present

## 2023-12-19 DIAGNOSIS — E890 Postprocedural hypothyroidism: Secondary | ICD-10-CM | POA: Diagnosis not present

## 2023-12-20 DIAGNOSIS — E559 Vitamin D deficiency, unspecified: Secondary | ICD-10-CM | POA: Diagnosis not present

## 2024-01-02 ENCOUNTER — Ambulatory Visit: Payer: 59 | Admitting: Urology

## 2024-01-06 ENCOUNTER — Ambulatory Visit: Admitting: Family Medicine

## 2024-01-06 DIAGNOSIS — N946 Dysmenorrhea, unspecified: Secondary | ICD-10-CM | POA: Diagnosis not present

## 2024-01-06 DIAGNOSIS — Z8742 Personal history of other diseases of the female genital tract: Secondary | ICD-10-CM | POA: Diagnosis not present

## 2024-01-06 DIAGNOSIS — D259 Leiomyoma of uterus, unspecified: Secondary | ICD-10-CM | POA: Diagnosis not present

## 2024-01-06 DIAGNOSIS — N83201 Unspecified ovarian cyst, right side: Secondary | ICD-10-CM | POA: Diagnosis not present

## 2024-01-06 DIAGNOSIS — N920 Excessive and frequent menstruation with regular cycle: Secondary | ICD-10-CM | POA: Diagnosis not present

## 2024-01-10 ENCOUNTER — Other Ambulatory Visit: Payer: Self-pay | Admitting: Obstetrics and Gynecology

## 2024-01-10 DIAGNOSIS — R2232 Localized swelling, mass and lump, left upper limb: Secondary | ICD-10-CM

## 2024-01-23 ENCOUNTER — Other Ambulatory Visit: Payer: Self-pay | Admitting: Obstetrics and Gynecology

## 2024-01-23 ENCOUNTER — Ambulatory Visit
Admission: RE | Admit: 2024-01-23 | Discharge: 2024-01-23 | Disposition: A | Discharge status: Home / Self Care | Source: Ambulatory Visit | Attending: Obstetrics and Gynecology | Admitting: Obstetrics and Gynecology

## 2024-01-23 DIAGNOSIS — R2232 Localized swelling, mass and lump, left upper limb: Secondary | ICD-10-CM | POA: Diagnosis not present

## 2024-01-23 DIAGNOSIS — D259 Leiomyoma of uterus, unspecified: Secondary | ICD-10-CM | POA: Diagnosis not present

## 2024-01-23 DIAGNOSIS — N920 Excessive and frequent menstruation with regular cycle: Secondary | ICD-10-CM | POA: Diagnosis not present

## 2024-01-23 DIAGNOSIS — N6320 Unspecified lump in the left breast, unspecified quadrant: Secondary | ICD-10-CM | POA: Diagnosis not present

## 2024-01-23 DIAGNOSIS — M79622 Pain in left upper arm: Secondary | ICD-10-CM | POA: Diagnosis not present

## 2024-01-23 DIAGNOSIS — R92323 Mammographic fibroglandular density, bilateral breasts: Secondary | ICD-10-CM | POA: Diagnosis not present

## 2024-02-15 ENCOUNTER — Other Ambulatory Visit: Payer: Self-pay

## 2024-02-15 ENCOUNTER — Encounter
Admission: RE | Admit: 2024-02-15 | Discharge: 2024-02-15 | Disposition: A | Discharge status: Home / Self Care | Source: Ambulatory Visit | Attending: Obstetrics and Gynecology | Admitting: Obstetrics and Gynecology

## 2024-02-15 VITALS — Wt 167.0 lb

## 2024-02-15 DIAGNOSIS — Z01818 Encounter for other preprocedural examination: Secondary | ICD-10-CM

## 2024-02-15 DIAGNOSIS — N921 Excessive and frequent menstruation with irregular cycle: Secondary | ICD-10-CM

## 2024-02-15 DIAGNOSIS — Z01812 Encounter for preprocedural laboratory examination: Secondary | ICD-10-CM

## 2024-02-15 DIAGNOSIS — E039 Hypothyroidism, unspecified: Secondary | ICD-10-CM

## 2024-02-15 HISTORY — DX: Arthropathic psoriasis, unspecified: L40.50

## 2024-02-15 HISTORY — DX: Personal history of other diseases of the digestive system: Z87.19

## 2024-02-15 HISTORY — DX: Excessive and frequent menstruation with irregular cycle: N92.1

## 2024-02-15 HISTORY — DX: Obesity, unspecified: E66.9

## 2024-02-15 HISTORY — DX: Leiomyoma of uterus, unspecified: D25.9

## 2024-02-15 HISTORY — DX: Postprocedural hypothyroidism: E89.0

## 2024-02-15 HISTORY — DX: Polyp of colon: K63.5

## 2024-02-15 HISTORY — DX: Nausea with vomiting, unspecified: R11.2

## 2024-02-15 HISTORY — DX: Personal history of nicotine dependence: Z87.891

## 2024-02-15 HISTORY — DX: Other hypotension: I95.89

## 2024-02-15 HISTORY — DX: Other complications of anesthesia, initial encounter: T88.59XA

## 2024-02-15 NOTE — Patient Instructions (Addendum)
 Your procedure is scheduled on:02-22-24 Wednesday Report to the Registration Desk on the 1st floor of the [facility].Then proceed to the 2nd floor Surgery Desk To find out your arrival time, please call [phone] between 1PM - 3PM on:02-21-24 Tuesday If your arrival time is 6:00 am, do not arrive before that time as the Medical Mall entrance doors do not open until 6:00 am.  REMEMBER: Instructions that are not followed completely may result in serious medical risk, up to and including death; or upon the discretion of your surgeon and anesthesiologist your surgery may need to be rescheduled.  Do not eat food OR drink liquids after midnight the night before surgery.  No gum chewing or hard candies.  One week prior to surgery:Stop NOW (02-15-24) Stop Anti-inflammatories (NSAIDS) such as Advil , Aleve, Ibuprofen , Motrin , Naproxen, Naprosyn and Aspirin based products such as Excedrin, Goody's Powder, BC Powder.You may continue your celecoxib  (CELEBREX ) up until the day prior to surgery if needed Stop ANY OVER THE COUNTER supplements until after surgery (Vitamin D3, Nitric Oxide)  You may however, continue to take Tylenol  if needed for pain up until the day of surgery.  Continue taking all of your other prescription medications up until the day of surgery.  ON THE DAY OF SURGERY ONLY TAKE THESE MEDICATIONS WITH SIPS OF WATER : -levothyroxine (SYNTHROID)  -liothyronine (CYTOMEL)   No Alcohol for 24 hours before or after surgery.  No Smoking including e-cigarettes for 24 hours before surgery.  No chewable tobacco products for at least 6 hours before surgery.  No nicotine patches on the day of surgery.  Do not use any recreational drugs for at least a week (preferably 2 weeks) before your surgery.  Please be advised that the combination of cocaine and anesthesia may have negative outcomes, up to and including death. If you test positive for cocaine, your surgery will be cancelled.  On  the morning of surgery brush your teeth with toothpaste and water , you may rinse your mouth with mouthwash if you wish. Do not swallow any toothpaste or mouthwash.  Use CHG Soap as directed on instruction sheet.  Do not wear jewelry, make-up, hairpins, clips or nail polish.  For welded (permanent) jewelry: bracelets, anklets, waist bands, etc.  Please have this removed prior to surgery.  If it is not removed, there is a chance that hospital personnel will need to cut it off on the day of surgery.  Do not wear lotions, powders, or perfumes.   Do not shave body hair from the neck down 48 hours before surgery.  Contact lenses, hearing aids and dentures may not be worn into surgery.  Do not bring valuables to the hospital. [facility] is not responsible for any missing/lost belongings or valuables.  Notify your doctor if there is any change in your medical condition (cold, fever, infection).  Wear comfortable clothing (specific to your surgery type) to the hospital.  After surgery, you can help prevent lung complications by doing breathing exercises.  Take deep breaths and cough every 1-2 hours. Your doctor may order a device called an Incentive Spirometer to help you take deep breaths. When coughing or sneezing, hold a pillow firmly against your incision with both hands. This is called "splinting." Doing this helps protect your incision. It also decreases belly discomfort.  If you are being admitted to the hospital overnight, leave your suitcase in the car. After surgery it may be brought to your room.  In case of increased patient census, it may  be necessary for you, the patient, to continue your postoperative care in the Same Day Surgery department.  If you are being discharged the day of surgery, you will not be allowed to drive home. You will need a responsible individual to drive you home and stay with you for 24 hours after surgery.   If you are taking public transportation, you  will need to have a responsible individual with you.  Please call the Pre-admissions Testing Dept. at [phone] if you have any questions about these instructions.  Surgery Visitation Policy:  Patients having surgery or a procedure may have two visitors.  Children under the age of 75 must have an adult with them who is not the patient.                                                                                                             Preparing for Surgery with CHLORHEXIDINE GLUCONATE (CHG) Soap  Chlorhexidine Gluconate (CHG) Soap  o An antiseptic cleaner that kills germs and bonds with the skin to continue killing germs even after washing  o Used for showering the night before surgery and morning of surgery  Before surgery, you can play an important role by reducing the number of germs on your skin.  CHG (Chlorhexidine gluconate) soap is an antiseptic cleanser which kills germs and bonds with the skin to continue killing germs even after washing.  Please do not use if you have an allergy to CHG or antibacterial soaps. If your skin becomes reddened/irritated stop using the CHG.  1. Shower the NIGHT BEFORE SURGERY with CHG soap.  2. If you choose to wash your hair, wash your hair first as usual with your normal shampoo.  3. After shampooing, rinse your hair and body thoroughly to remove the shampoo.  4. Use CHG as you would any other liquid soap. You can apply CHG directly to the skin and wash gently with a clean washcloth.  5. Apply the CHG soap to your body only from the neck down. Do not use on open wounds or open sores. Avoid contact with your eyes, ears, mouth, and genitals (private parts). Wash face and genitals (private parts) with your normal soap.  6. Wash thoroughly, paying special attention to the area where your surgery will be performed.  7. Thoroughly rinse your body with warm water .  8. Do not shower/wash with your normal soap after using and rinsing  off the CHG soap.  9. Do not use lotions, oils, etc., after showering with CHG.  10. Pat yourself dry with a clean towel.  11. Wear clean pajamas to bed the night before surgery.  12. Place clean sheets on your bed the night of your shower and do not sleep with pets.  13. Do not apply any deodorants/lotions/powders.  14. Please wear clean clothes to the hospital.  15. Remember to brush your teeth with your regular toothpaste.   Merchandiser, retail to address health-related social needs:  [email or web address]

## 2024-02-20 ENCOUNTER — Encounter
Admission: RE | Admit: 2024-02-20 | Discharge: 2024-02-20 | Disposition: A | Discharge status: Home / Self Care | Source: Ambulatory Visit | Attending: Obstetrics and Gynecology | Admitting: Obstetrics and Gynecology

## 2024-02-20 DIAGNOSIS — E039 Hypothyroidism, unspecified: Secondary | ICD-10-CM | POA: Diagnosis not present

## 2024-02-20 DIAGNOSIS — N920 Excessive and frequent menstruation with regular cycle: Secondary | ICD-10-CM | POA: Insufficient documentation

## 2024-02-20 DIAGNOSIS — Z01818 Encounter for other preprocedural examination: Secondary | ICD-10-CM | POA: Diagnosis present

## 2024-02-20 DIAGNOSIS — Z01812 Encounter for preprocedural laboratory examination: Secondary | ICD-10-CM | POA: Diagnosis not present

## 2024-02-20 DIAGNOSIS — D259 Leiomyoma of uterus, unspecified: Secondary | ICD-10-CM | POA: Insufficient documentation

## 2024-02-20 LAB — CBC
HCT: 41.1 % (ref 36.0–46.0)
Hemoglobin: 13.9 g/dL (ref 12.0–15.0)
MCH: 32 pg (ref 26.0–34.0)
MCHC: 33.8 g/dL (ref 30.0–36.0)
MCV: 94.5 fL (ref 80.0–100.0)
Platelets: 325 K/uL (ref 150–400)
RBC: 4.35 MIL/uL (ref 3.87–5.11)
RDW: 12.9 % (ref 11.5–15.5)
WBC: 6.1 K/uL (ref 4.0–10.5)
nRBC: 0 % (ref 0.0–0.2)

## 2024-02-20 LAB — BASIC METABOLIC PANEL WITH GFR
Anion gap: 10 (ref 5–15)
BUN: 28 mg/dL — ABNORMAL HIGH (ref 6–20)
CO2: 25 mmol/L (ref 22–32)
Calcium: 8.5 mg/dL — ABNORMAL LOW (ref 8.9–10.3)
Chloride: 105 mmol/L (ref 98–111)
Creatinine, Ser: 1.08 mg/dL — ABNORMAL HIGH (ref 0.44–1.00)
GFR, Estimated: >60 mL/min (ref >60)
Glucose, Bld: 88 mg/dL (ref 70–99)
Potassium: 4 mmol/L (ref 3.5–5.1)
Sodium: 140 mmol/L (ref 135–145)

## 2024-02-20 LAB — TYPE AND SCREEN
ABO/RH(D): A POS
Antibody Screen: NEGATIVE

## 2024-02-22 ENCOUNTER — Other Ambulatory Visit: Payer: Self-pay

## 2024-02-22 ENCOUNTER — Ambulatory Visit

## 2024-02-22 ENCOUNTER — Ambulatory Visit
Admission: RE | Admit: 2024-02-22 | Discharge: 2024-02-22 | Disposition: A | Discharge status: Home / Self Care | Attending: Obstetrics and Gynecology | Admitting: Obstetrics and Gynecology

## 2024-02-22 ENCOUNTER — Encounter
Admission: RE | Disposition: A | Discharge status: Home / Self Care | Payer: Self-pay | Source: Home / Self Care | Attending: Obstetrics and Gynecology

## 2024-02-22 ENCOUNTER — Ambulatory Visit: Payer: Self-pay | Admitting: Urgent Care

## 2024-02-22 ENCOUNTER — Encounter: Payer: Self-pay | Admitting: Obstetrics and Gynecology

## 2024-02-22 ENCOUNTER — Other Ambulatory Visit: Payer: Self-pay | Admitting: Family Medicine

## 2024-02-22 DIAGNOSIS — E890 Postprocedural hypothyroidism: Secondary | ICD-10-CM | POA: Diagnosis not present

## 2024-02-22 DIAGNOSIS — N83292 Other ovarian cyst, left side: Secondary | ICD-10-CM | POA: Insufficient documentation

## 2024-02-22 DIAGNOSIS — N888 Other specified noninflammatory disorders of cervix uteri: Secondary | ICD-10-CM | POA: Insufficient documentation

## 2024-02-22 DIAGNOSIS — N8003 Adenomyosis of the uterus: Secondary | ICD-10-CM | POA: Insufficient documentation

## 2024-02-22 DIAGNOSIS — S29012A Strain of muscle and tendon of back wall of thorax, initial encounter: Secondary | ICD-10-CM

## 2024-02-22 DIAGNOSIS — N838 Other noninflammatory disorders of ovary, fallopian tube and broad ligament: Secondary | ICD-10-CM | POA: Insufficient documentation

## 2024-02-22 DIAGNOSIS — G43909 Migraine, unspecified, not intractable, without status migrainosus: Secondary | ICD-10-CM | POA: Insufficient documentation

## 2024-02-22 DIAGNOSIS — N83202 Unspecified ovarian cyst, left side: Secondary | ICD-10-CM | POA: Insufficient documentation

## 2024-02-22 DIAGNOSIS — D259 Leiomyoma of uterus, unspecified: Secondary | ICD-10-CM | POA: Diagnosis not present

## 2024-02-22 DIAGNOSIS — Z01818 Encounter for other preprocedural examination: Secondary | ICD-10-CM

## 2024-02-22 DIAGNOSIS — D251 Intramural leiomyoma of uterus: Secondary | ICD-10-CM | POA: Insufficient documentation

## 2024-02-22 DIAGNOSIS — G4730 Sleep apnea, unspecified: Secondary | ICD-10-CM | POA: Insufficient documentation

## 2024-02-22 DIAGNOSIS — Z87891 Personal history of nicotine dependence: Secondary | ICD-10-CM | POA: Diagnosis not present

## 2024-02-22 DIAGNOSIS — Z01812 Encounter for preprocedural laboratory examination: Secondary | ICD-10-CM

## 2024-02-22 DIAGNOSIS — N920 Excessive and frequent menstruation with regular cycle: Secondary | ICD-10-CM | POA: Insufficient documentation

## 2024-02-22 DIAGNOSIS — J4520 Mild intermittent asthma, uncomplicated: Secondary | ICD-10-CM | POA: Diagnosis not present

## 2024-02-22 HISTORY — PX: ROBOTIC ASSISTED LAPAROSCOPIC OVARIAN CYSTECTOMY: SHX6081

## 2024-02-22 HISTORY — PX: HYSTERECTOMY, TOTAL, LAPAROSCOPIC, ROBOT-ASSISTED WITH SALPINGECTOMY: SHX7587

## 2024-02-22 HISTORY — PX: CYSTOSCOPY: SHX5120

## 2024-02-22 LAB — POCT PREGNANCY, URINE: Preg Test, Ur: NEGATIVE

## 2024-02-22 LAB — ABO/RH: ABO/RH(D): A POS

## 2024-02-22 SURGERY — HYSTERECTOMY, TOTAL, LAPAROSCOPIC, ROBOT-ASSISTED WITH SALPINGECTOMY
Anesthesia: General | Site: Uterus

## 2024-02-22 MED ORDER — DROPERIDOL 2.5 MG/ML IJ SOLN: 0.6250 mg | Freq: Once | INTRAMUSCULAR | Status: DC | PRN

## 2024-02-22 MED ORDER — HYDROMORPHONE HCL 1 MG/ML IJ SOLN
INTRAMUSCULAR | Status: AC
  Filled 2024-02-22: qty 1

## 2024-02-22 MED ORDER — ONDANSETRON 4 MG PO TBDP: 4.0000 mg | ORAL_TABLET | Freq: Four times a day (QID) | ORAL | 0 refills | Status: DC | PRN

## 2024-02-22 MED ORDER — LIDOCAINE HCL (PF) 2 % IJ SOLN
INTRAMUSCULAR | Status: AC
  Filled 2024-02-22: qty 5

## 2024-02-22 MED ORDER — SOD CITRATE-CITRIC ACID 500-334 MG/5ML PO SOLN: 30.0000 mL | ORAL | Status: DC

## 2024-02-22 MED ORDER — SODIUM CHLORIDE 0.9 % IV SOLN
INTRAVENOUS | Status: AC
Start: 2024-02-22 — End: 2024-02-22

## 2024-02-22 MED ORDER — DEXAMETHASONE SOD PHOSPHATE PF 10 MG/ML IJ SOLN
INTRAMUSCULAR | Status: DC | PRN
  Administered 2024-02-22: 10 mg via INTRAVENOUS

## 2024-02-22 MED ORDER — CEFAZOLIN SODIUM-DEXTROSE 2-4 GM/100ML-% IV SOLN
2.0000 g | INTRAVENOUS | Status: AC
  Administered 2024-02-22: 2 g via INTRAVENOUS

## 2024-02-22 MED ORDER — POVIDONE-IODINE 10 % EX SWAB
2.0000 | Freq: Once | CUTANEOUS | Status: AC
  Administered 2024-02-22: 2 via TOPICAL

## 2024-02-22 MED ORDER — DEXMEDETOMIDINE HCL IN NACL 80 MCG/20ML IV SOLN
INTRAVENOUS | Status: DC | PRN
  Administered 2024-02-22: 4 ug via INTRAVENOUS
  Administered 2024-02-22: 8 ug via INTRAVENOUS

## 2024-02-22 MED ORDER — GLYCOPYRROLATE 0.2 MG/ML IJ SOLN
INTRAMUSCULAR | Status: DC | PRN
  Administered 2024-02-22: .1 mg via INTRAVENOUS

## 2024-02-22 MED ORDER — KETAMINE HCL 50 MG/5ML IJ SOSY
PREFILLED_SYRINGE | INTRAMUSCULAR | Status: DC | PRN
  Administered 2024-02-22: 30 mg via INTRAVENOUS

## 2024-02-22 MED ORDER — CHLORHEXIDINE GLUCONATE 0.12 % MT SOLN
OROMUCOSAL | Status: AC
  Filled 2024-02-22: qty 15

## 2024-02-22 MED ORDER — BUPIVACAINE HCL 0.5 % IJ SOLN
INTRAMUSCULAR | Status: DC | PRN
  Administered 2024-02-22: 13 mL

## 2024-02-22 MED ORDER — PROPOFOL 500 MG/50ML IV EMUL
INTRAVENOUS | Status: DC | PRN
  Administered 2024-02-22: 150 ug/kg/min via INTRAVENOUS
  Administered 2024-02-22: 125 ug/kg/min via INTRAVENOUS

## 2024-02-22 MED ORDER — MIDAZOLAM HCL 2 MG/2ML IJ SOLN
INTRAMUSCULAR | Status: AC
  Filled 2024-02-22: qty 2

## 2024-02-22 MED ORDER — EPHEDRINE SULFATE-NACL 50-0.9 MG/10ML-% IV SOSY
PREFILLED_SYRINGE | INTRAVENOUS | Status: DC | PRN
  Administered 2024-02-22: 10 mg via INTRAVENOUS

## 2024-02-22 MED ORDER — SILVER NITRATE-POT NITRATE 75-25 % EX MISC
CUTANEOUS | Status: AC
  Filled 2024-02-22: qty 10

## 2024-02-22 MED ORDER — BUPIVACAINE HCL (PF) 0.5 % IJ SOLN
INTRAMUSCULAR | Status: AC
Start: 2024-02-22 — End: 2024-02-22
  Filled 2024-02-22: qty 30

## 2024-02-22 MED ORDER — PROPOFOL 10 MG/ML IV BOLUS
INTRAVENOUS | Status: DC | PRN
  Administered 2024-02-22: 150 mg via INTRAVENOUS

## 2024-02-22 MED ORDER — FENTANYL CITRATE (PF) 100 MCG/2ML IJ SOLN
INTRAMUSCULAR | Status: AC
  Filled 2024-02-22: qty 2

## 2024-02-22 MED ORDER — FENTANYL CITRATE (PF) 100 MCG/2ML IJ SOLN
INTRAMUSCULAR | Status: DC | PRN
  Administered 2024-02-22 (×2): 50 ug via INTRAVENOUS

## 2024-02-22 MED ORDER — 0.9 % SODIUM CHLORIDE (POUR BTL) OPTIME
TOPICAL | Status: DC | PRN
  Administered 2024-02-22: 500 mL

## 2024-02-22 MED ORDER — ONDANSETRON HCL 4 MG/2ML IJ SOLN
INTRAMUSCULAR | Status: AC
  Filled 2024-02-22: qty 2

## 2024-02-22 MED ORDER — SODIUM CHLORIDE 0.9 % IR SOLN
Status: DC | PRN
  Administered 2024-02-22: 400 mL
  Administered 2024-02-22: 200 mL via INTRAVESICAL

## 2024-02-22 MED ORDER — IBUPROFEN 600 MG PO TABS: 600.0000 mg | ORAL_TABLET | Freq: Four times a day (QID) | ORAL | 0 refills | Status: DC

## 2024-02-22 MED ORDER — SCOPOLAMINE 1 MG/3DAYS TD PT72
1.0000 | MEDICATED_PATCH | Freq: Once | TRANSDERMAL | Status: DC
  Administered 2024-02-22: 1 mg via TRANSDERMAL

## 2024-02-22 MED ORDER — OXYCODONE HCL 5 MG PO TABS
ORAL_TABLET | ORAL | Status: AC
  Filled 2024-02-22: qty 1

## 2024-02-22 MED ORDER — OXYCODONE HCL 5 MG/5ML PO SOLN: 5.0000 mg | Freq: Once | ORAL | Status: AC | PRN

## 2024-02-22 MED ORDER — CHLORHEXIDINE GLUCONATE 0.12 % MT SOLN
15.0000 mL | Freq: Once | OROMUCOSAL | Status: AC
  Administered 2024-02-22: 15 mL via OROMUCOSAL

## 2024-02-22 MED ORDER — DEXMEDETOMIDINE HCL IN NACL 80 MCG/20ML IV SOLN
INTRAVENOUS | Status: AC
Start: 2024-02-22 — End: 2024-02-22
  Filled 2024-02-22: qty 20

## 2024-02-22 MED ORDER — LIDOCAINE HCL (CARDIAC) PF 100 MG/5ML IV SOSY
PREFILLED_SYRINGE | INTRAVENOUS | Status: DC | PRN
  Administered 2024-02-22: 80 mg via INTRAVENOUS

## 2024-02-22 MED ORDER — OXYCODONE HCL 5 MG PO TABS
5.0000 mg | ORAL_TABLET | Freq: Once | ORAL | Status: AC | PRN
  Administered 2024-02-22: 5 mg via ORAL

## 2024-02-22 MED ORDER — ROCURONIUM BROMIDE 100 MG/10ML IV SOLN
INTRAVENOUS | Status: DC | PRN
  Administered 2024-02-22 (×2): 10 mg via INTRAVENOUS
  Administered 2024-02-22 (×2): 20 mg via INTRAVENOUS
  Administered 2024-02-22: 50 mg via INTRAVENOUS

## 2024-02-22 MED ORDER — MIDAZOLAM HCL 2 MG/2ML IJ SOLN
INTRAMUSCULAR | Status: DC | PRN
  Administered 2024-02-22: 2 mg via INTRAVENOUS

## 2024-02-22 MED ORDER — CEFAZOLIN SODIUM-DEXTROSE 2-4 GM/100ML-% IV SOLN
INTRAVENOUS | Status: AC
  Filled 2024-02-22: qty 100

## 2024-02-22 MED ORDER — OXYCODONE-ACETAMINOPHEN 5-325 MG PO TABS: 1.0000 | ORAL_TABLET | Freq: Four times a day (QID) | ORAL | 0 refills | Status: DC | PRN

## 2024-02-22 MED ORDER — KETAMINE HCL 50 MG/5ML IJ SOSY
PREFILLED_SYRINGE | INTRAMUSCULAR | Status: AC
  Filled 2024-02-22: qty 5

## 2024-02-22 MED ORDER — SCOPOLAMINE 1 MG/3DAYS TD PT72
MEDICATED_PATCH | TRANSDERMAL | Status: AC
  Filled 2024-02-22: qty 1

## 2024-02-22 MED ORDER — ACETAMINOPHEN 10 MG/ML IV SOLN
INTRAVENOUS | Status: AC
Start: 2024-02-22 — End: 2024-02-22
  Filled 2024-02-22: qty 100

## 2024-02-22 MED ORDER — ORAL CARE MOUTH RINSE: 15.0000 mL | Freq: Once | OROMUCOSAL | Status: AC

## 2024-02-22 MED ORDER — ONDANSETRON HCL 4 MG/2ML IJ SOLN
INTRAMUSCULAR | Status: DC | PRN
  Administered 2024-02-22: 4 mg via INTRAVENOUS

## 2024-02-22 MED ORDER — PROPOFOL 10 MG/ML IV BOLUS
INTRAVENOUS | Status: AC
Start: 2024-02-22 — End: 2024-02-22
  Filled 2024-02-22: qty 20

## 2024-02-22 MED ORDER — HYDROMORPHONE HCL 1 MG/ML IJ SOLN
0.2500 mg | INTRAMUSCULAR | Status: DC | PRN
  Administered 2024-02-22: 0.5 mg via INTRAVENOUS
  Administered 2024-02-22 (×2): 0.25 mg via INTRAVENOUS

## 2024-02-22 MED ORDER — ROCURONIUM BROMIDE 10 MG/ML (PF) SYRINGE
PREFILLED_SYRINGE | INTRAVENOUS | Status: AC
Start: 2024-02-22 — End: 2024-02-22
  Filled 2024-02-22: qty 10

## 2024-02-22 MED ORDER — ACETAMINOPHEN 10 MG/ML IV SOLN
INTRAVENOUS | Status: DC | PRN
  Administered 2024-02-22: 1000 mg via INTRAVENOUS

## 2024-02-22 MED ORDER — LACTATED RINGERS IV SOLN
INTRAVENOUS | Status: DC | PRN
Start: 2024-02-22 — End: 2024-02-22

## 2024-02-22 MED ORDER — LACTATED RINGERS IV SOLN: INTRAVENOUS | Status: DC

## 2024-02-22 MED ORDER — PROPOFOL 1000 MG/100ML IV EMUL
INTRAVENOUS | Status: AC
Start: 2024-02-22 — End: 2024-02-22
  Filled 2024-02-22: qty 100

## 2024-02-22 SURGICAL SUPPLY — 60 items
BAG PRESSURE INF REUSE 1000 (BAG) IMPLANT
BAG URINE DRAIN 2000ML AR STRL (UROLOGICAL SUPPLIES) ×3 IMPLANT
BLADE SURG 15 STRL LF DISP TIS (BLADE) ×3 IMPLANT
BLADE SURG SZ11 CARB STEEL (BLADE) ×3 IMPLANT
CANNULA CAP OBTURATR AIRSEAL 8 (CAP) ×3 IMPLANT
CATH URTH 16FR FL 2W BLN LF (CATHETERS) ×3 IMPLANT
COVER TIP SHEARS 8 DVNC (MISCELLANEOUS) ×3 IMPLANT
DEFOGGER SCOPE WARM SEASHARP (MISCELLANEOUS) ×3 IMPLANT
DERMABOND ADVANCED .7 DNX12 (GAUZE/BANDAGES/DRESSINGS) ×3 IMPLANT
DRAPE ARM DVNC X/XI (DISPOSABLE) ×12 IMPLANT
DRAPE COLUMN DVNC XI (DISPOSABLE) ×3 IMPLANT
DRAPE UNDER BUTTOCK W/FLU (DRAPES) ×3 IMPLANT
DRIVER NDL MEGA SUTCUT DVNCXI (INSTRUMENTS) ×3 IMPLANT
DRIVER NDLE MEGA SUTCUT DVNCXI (INSTRUMENTS) ×3 IMPLANT
ELECTRODE REM PT RTRN 9FT ADLT (ELECTROSURGICAL) ×3 IMPLANT
FORCEPS BPLR FENES DVNC XI (FORCEP) ×3 IMPLANT
FORCEPS BPLR R/ABLATION 8 DVNC (INSTRUMENTS) ×3 IMPLANT
FORCEPS TENACULUM DVNC XI (FORCEP) IMPLANT
GAUZE 4X4 16PLY ~~LOC~~+RFID DBL (SPONGE) ×3 IMPLANT
GLOVE BIO SURGEON STRL SZ7 (GLOVE) ×9 IMPLANT
GLOVE BIOGEL PI IND STRL 7.5 (GLOVE) ×9 IMPLANT
GOWN STRL REUS W/ TWL LRG LVL3 (GOWN DISPOSABLE) ×9 IMPLANT
GOWN STRL REUS W/ TWL XL LVL3 (GOWN DISPOSABLE) ×3 IMPLANT
IRRIGATION STRYKERFLOW (MISCELLANEOUS) IMPLANT
IRRIGATOR SUCT 8 DISP DVNC XI (IRRIGATION / IRRIGATOR) IMPLANT
IV 0.9% NACL 1000 ML (IV SOLUTION) ×3 IMPLANT
IV LR 1000 ML (IV SOLUTION) ×3 IMPLANT
KIT PINK PAD W/HEAD ARM REST (MISCELLANEOUS) ×3 IMPLANT
KIT TURNOVER CYSTO (KITS) ×3 IMPLANT
LABEL OR SOLS (LABEL) ×3 IMPLANT
MANIFOLD NEPTUNE II (INSTRUMENTS) ×3 IMPLANT
MANIPULATOR VCARE LG CRV RETR (MISCELLANEOUS) IMPLANT
MANIPULATOR VCARE STD CRV RETR (MISCELLANEOUS) IMPLANT
NDL 21 GA WING INFUSION (NEEDLE) IMPLANT
NDL HYPO 22X1.5 SAFETY MO (MISCELLANEOUS) ×3 IMPLANT
NEEDLE 21 GA WING INFUSION (NEEDLE) IMPLANT
NEEDLE HYPO 22X1.5 SAFETY MO (MISCELLANEOUS) ×3 IMPLANT
NS IRRIG 500ML POUR BTL (IV SOLUTION) ×3 IMPLANT
OBTURATOR OPTICALSTD 8 DVNC (TROCAR) ×3 IMPLANT
OCCLUDER COLPOPNEUMO (BALLOONS) ×3 IMPLANT
PACK GYN LAPAROSCOPIC (MISCELLANEOUS) ×3 IMPLANT
PAD PREP OB/GYN DISP 24X41 (PERSONAL CARE ITEMS) ×3 IMPLANT
SCISSORS MNPLR CVD DVNC XI (INSTRUMENTS) ×3 IMPLANT
SCRUB CHG 4% DYNA-HEX 4OZ (MISCELLANEOUS) ×3 IMPLANT
SEAL UNIV 5-12 XI (MISCELLANEOUS) ×6 IMPLANT
SEALER VESSEL EXT DVNC XI (MISCELLANEOUS) IMPLANT
SET CYSTO IRRIGATION (SET/KITS/TRAYS/PACK) ×3 IMPLANT
SET TUBE FILTERED XL AIRSEAL (SET/KITS/TRAYS/PACK) ×3 IMPLANT
SOLUTION ELECTROSURG ANTI STCK (MISCELLANEOUS) ×3 IMPLANT
SOLUTION PREP PVP 2OZ (MISCELLANEOUS) ×3 IMPLANT
SPONGE T-LAP 18X18 ~~LOC~~+RFID (SPONGE) IMPLANT
SURGILUBE 2OZ TUBE FLIPTOP (MISCELLANEOUS) ×3 IMPLANT
SUT STRATA PDS 0 30 CT-2.5 (SUTURE) ×3 IMPLANT
SUT STRATAFIX SPIRAL PDS+ 0 30 (SUTURE) IMPLANT
SUTURE MNCRL 4-0 27XMF (SUTURE) ×3 IMPLANT
SYR 10ML LL (SYRINGE) ×3 IMPLANT
SYR 50ML LL SCALE MARK (SYRINGE) ×3 IMPLANT
TRAP FLUID SMOKE EVACUATOR (MISCELLANEOUS) ×3 IMPLANT
TUBING ART PRESS 48 MALE/FEM (TUBING) IMPLANT
WATER STERILE IRR 500ML POUR (IV SOLUTION) ×3 IMPLANT

## 2024-02-22 NOTE — Anesthesia Postprocedure Evaluation (Addendum)
 Anesthesia Post Note  Patient: Bari DELENA Phenes  Procedure(s) Performed: HYSTERECTOMY, TOTAL, LAPAROSCOPIC, ROBOT-ASSISTED WITH SALPINGECTOMY (Bilateral: Uterus) CYSTOSCOPY (Bladder) EXCISION, CYST, OVARY, ROBOT-ASSISTED, LAPAROSCOPIC (Left: Pelvis)  Patient location during evaluation: PACU Anesthesia Type: General Level of consciousness: awake and alert Pain management: pain level controlled Vital Signs Assessment: post-procedure vital signs reviewed and stable Respiratory status: spontaneous breathing, nonlabored ventilation, respiratory function stable and patient connected to nasal cannula oxygen Cardiovascular status: blood pressure returned to baseline and stable Postop Assessment: no apparent nausea or vomiting Anesthetic complications: no   No notable events documented.   Last Vitals:  Vitals:   02/22/24 1145 02/22/24 1215  BP: 112/63 102/72  Pulse: 67 73  Resp: 12 16  Temp: 36.4 C 36.6 C  SpO2: 95% 99%    Last Pain:  Vitals:   02/22/24 1215  TempSrc: Temporal  PainSc: 5                  Lendia LITTIE Mae

## 2024-02-22 NOTE — Anesthesia Procedure Notes (Signed)
 Procedure Name: Intubation Date/Time: 02/22/2024 7:46 AM  Performed by: Myra Lawless, CRNAPre-anesthesia Checklist: Patient identified, Patient being monitored, Timeout performed, Emergency Drugs available and Suction available Patient Re-evaluated:Patient Re-evaluated prior to induction Oxygen Delivery Method: Circle system utilized Preoxygenation: Pre-oxygenation with 100% oxygen Induction Type: IV induction Ventilation: Mask ventilation without difficulty Laryngoscope Size: Mac and 3 Grade View: Grade I Tube type: Oral Tube size: 7.0 mm Number of attempts: 1 Airway Equipment and Method: Stylet Placement Confirmation: ETT inserted through vocal cords under direct vision, positive ETCO2 and breath sounds checked- equal and bilateral Secured at: 20 cm Tube secured with: Tape Dental Injury: Teeth and Oropharynx as per pre-operative assessment

## 2024-02-22 NOTE — Anesthesia Preprocedure Evaluation (Addendum)
 Anesthesia Evaluation  Patient identified by MRN, date of birth, ID band Patient awake    Reviewed: Allergy & Precautions, NPO status , Patient's Chart, lab work & pertinent test results  History of Anesthesia Complications (+) PONV and history of anesthetic complications  Airway Mallampati: I  TM Distance: >3 FB Neck ROM: full    Dental no notable dental hx.    Pulmonary asthma , sleep apnea , former smoker   Pulmonary exam normal        Cardiovascular negative cardio ROS Normal cardiovascular exam     Neuro/Psych  Headaches PSYCHIATRIC DISORDERS Anxiety      Neuromuscular disease    GI/Hepatic negative GI ROS, Neg liver ROS,,,  Endo/Other  Hypothyroidism    Renal/GU      Musculoskeletal   Abdominal   Peds  Hematology negative hematology ROS (+)   Anesthesia Other Findings Past Medical History: No date: Allergy No date: Anxiety No date: Asthma     Comment:  childhood No date: Chronic low blood pressure No date: Complication of anesthesia No date: Fibroid uterus No date: Former smoker No date: History of gastroesophageal reflux (GERD) No date: Menorrhagia with irregular cycle No date: Migraines No date: Obesity No date: Ovarian cyst No date: Polyp of sigmoid colon No date: PONV (postoperative nausea and vomiting)     Comment:  with Laparoscopy No date: Postoperative hypothyroidism No date: Psoriatic arthritis (HCC) No date: Sleep apnea     Comment:  no CPAP-lost weight and does not have to use No date: Thyroid  disease  Past Surgical History: 06/26/2021: COLONOSCOPY WITH PROPOFOL ; N/A     Comment:  Procedure: COLONOSCOPY WITH BIOPSY;  Surgeon: Jinny Carmine, MD;  Location: [facility];  Service:               Endoscopy;  Laterality: N/A; 1997: LAPAROSCOPY 2018: NASAL SINUS SURGERY 06/26/2021: POLYPECTOMY; N/A     Comment:  Procedure: POLYPECTOMY;  Surgeon: Jinny Carmine, MD;                 Location: [facility];  Service: Endoscopy;                Laterality: N/A; 2018: RHINOPLASTY 2012: THYROIDECTOMY 1996: WISDOM TOOTH EXTRACTION; Bilateral  BMI    Body Mass Index: 28.67 kg/m      Reproductive/Obstetrics negative OB ROS                              Anesthesia Physical Anesthesia Plan  ASA: 2  Anesthesia Plan: General ETT   Post-op Pain Management: Toradol IV (intra-op)*, Lidocaine  infusion*, Dilaudid  IV and Ketamine IV*   Induction: Intravenous  PONV Risk Score and Plan: 4 or greater and Ondansetron , Dexamethasone, Midazolam, Treatment may vary due to age or medical condition, Propofol  infusion and TIVA  Airway Management Planned: Oral ETT  Additional Equipment:   Intra-op Plan:   Post-operative Plan: Extubation in OR  Informed Consent: I have reviewed the patients History and Physical, chart, labs and discussed the procedure including the risks, benefits and alternatives for the proposed anesthesia with the patient or authorized representative who has indicated his/her understanding and acceptance.     Dental Advisory Given  Plan Discussed with: Anesthesiologist, CRNA and Surgeon  Anesthesia Plan Comments: (Patient consented for risks of anesthesia including but not limited to:  - adverse reactions to  medications - damage to eyes, teeth, lips or other oral mucosa - nerve damage due to positioning  - sore throat or hoarseness - Damage to heart, brain, nerves, lungs, other parts of body or loss of life  Patient voiced understanding and assent.)         Anesthesia Quick Evaluation

## 2024-02-22 NOTE — Transfer of Care (Signed)
 Immediate Anesthesia Transfer of Care Note  Patient: Alison Logan  Procedure(s) Performed: HYSTERECTOMY, TOTAL, LAPAROSCOPIC, ROBOT-ASSISTED WITH SALPINGECTOMY (Bilateral: Uterus) CYSTOSCOPY (Bladder) EXCISION, CYST, OVARY, ROBOT-ASSISTED, LAPAROSCOPIC (Left: Pelvis)  Patient Location: PACU  Anesthesia Type:General  Level of Consciousness: drowsy  Airway & Oxygen Therapy: Patient Spontanous Breathing and Patient connected to face mask oxygen  Post-op Assessment: Report given to RN, Post -op Vital signs reviewed and stable, and Patient moving all extremities X 4  Post vital signs: Reviewed and stable  Last Vitals:  Vitals Value Taken Time  BP 109/58 02/22/24 10:50  Temp 36.3 C 02/22/24 10:50  Pulse 82 02/22/24 10:51  Resp 19 02/22/24 10:51  SpO2 100 % 02/22/24 10:51  Vitals shown include unfiled device data.  Last Pain:  Vitals:   02/22/24 1050  TempSrc:   PainSc: Asleep         Complications: No notable events documented.

## 2024-02-22 NOTE — Op Note (Signed)
 Operative Note    Name: Alison Logan  Date of Service: 02/22/2024   DOB: 12-09-1975  MRN: [id number]    Pre-Operative Diagnosis:  1) Menorrhagia with regular cycle 2) Fibroid uterus 3) left ovarian cyst  Post-Operative Diagnosis:  1) Menorrhagia with regular cycle 2) Fibroid uterus 3) left ovarian cyst  Procedures:  1) Robot assisted Total Laparoscopic Hysterectomy, bilateral salpingectomy  2) Cystoscopy 3) Left ovarian cystectomy  Primary Surgeon: Garnette Mace, MD   EBL: 50 mL   IVF: 1,000 mL   Urine output: 200 mL  Specimens:  1) Uterus, cervix, and bilateral fallopian tubes (fibroid with uterus) 2) Left ovarian cyst  Drains: none  Complications: None   Disposition: PACU   Condition: Stable   Findings:  1) enlarged uterus with apparent left lateral fibroid 2) normal appearing right ovary, bilateral fallopian tubes 3) Left ovarian cyst on surface with yellow color, corpus luteal cyst versus dermoid   Procedure Summary:  The patient was taken to the operating room where general anesthesia was administered and found to be adequate. She was placed in the dorsal supine lithotomy position in [place] stirrups and prepped and draped in the usual, sterile fashion. After a timeout was called an indwelling catheter was placed in her bladder.  A sterile speculum was placed in her vagina.  The anterior lip of the cervix was grasped with the single-tooth tenaculum.  The cervix was serially dilated to an 11 Pratt dilator.  The large Vcare device was placed in accordance to the manufacturer's recommendations.  The tenaculum and speculum were removed.   Attention was turned to the abdomen where after injection of local anesthetic, an 8 mm infraumbilical incision was made with the scalpel. Entry into the abdomen was obtained via Optiview trocar technique (a blunt entry technique with camera visualization through the obturator upon entry). Verification of entry into the abdomen  was obtained using opening pressures. The abdomen was insufflated with CO2. The camera was introduced through the trocar with verification of atraumatic entry.  Right and left abdominal entry sites were created after injection of local anesthetic about 8 cm lateral to the umbilical port in accordance with the Intuitive manufacturer's recommendations.  An additional port was placed 8 cm lateral to the right abdominal port with verification of clearance above the iliac crest by more than 2 cm.  The port sites were 8 mm.  The intuitive trochars were introduced under intra-abdominal camera visualization without difficulty   The XI robot was docked on the patient's left.  Clearance was verified from the patient's legs.  Through the umbilical port the camera was placed.  Through the port attached to arm 3 the monopolar scissors were placed.  Through the port attached to arm 4 the forced bipolar forceps were was placed.  The fenestrated bipolar forceps was attached to port 1.   An inspection was undertaken of the pelvis with the above-noted findings. The bilateral ureters were identified and found to be well away from the operative area of interest. The right fallopian tube was grasped at the fimbriated end and was transected along the mesosalpinx in a lateral-to-medial fashion.  The round ligament was cauterized and transected on the right. The broad ligament was opened posteriorly and anteriorly.  The utero-ovarian ligament was transected. Tissue was divided along the right broad ligament to the level of the internal cervical os.  Bladder tissue was dissected off the lower uterine segment and cervix without difficulty. The right uterine artery was skeletonized and identified  and after ligation was transected. The same procedure was carried out on the left side with care to stay close to the left lateral fibroid to avoid bladder injury. The colpotomy was performed using monopolar electrocautery in a circumferential  fashion following the KOH ring.  The uterus and fallopian tubes and cervix were removed through the vagina.  Attention was returned to the left ovary.  Given that the cystic lesion appeared to be on the surface of the ovary, the cyst was removed at its base from the ovary and passed off through the vaginal opening.  Hemostasis was obtained at the site of removal on the ovary.   Closure of the vaginal cuff was undertaken using the 0 Stratafix stitch in a running fashion.  All vascular pedicles were inspected and found to be hemostatic after lowering intraabdominal pressure to 5 mmHg.  All instruments removed from the robotic ports.  The robot was undocked from the patient.  The abdomen was then desufflated of CO2 with the aid of 5 deep breaths from anesthesia.  All trochars were then removed.  All skin incisions were closed using 4-0 Vicryl in a subcuticular fashion and reinforced using surgical skin glue.   Cystoscopy was undertaken at this point. The Foley catheter was removed and the 70 cystoscope was gently introduced through the urethra. The bladder survey was undertaken with efflux of urine from both orifices noted. There were no defects noted in the bladder wall. The cystoscope was utilized to nearly empty the bladder.  A sterile speculum was placed in the vagina.  The vaginal cuff was visualized and found to be hemostatic.  Also, no instruments or sponges were noted in the vagina.  The speculum was removed.  A digital sweep of the vagina was undertaken to ensure no instruments or sponges remained and this was verified to be clear.  The patient tolerated the procedure well.  Sponge, lap, needle, and instrument counts were correct x 2.  VTE prophylaxis: SCDs. Antibiotic prophylaxis: Ancef 2 grams IV prior to skin incision. She was awakened in the operating room and was taken to the PACU in stable condition.   Garnette Mace, MD, [facility] 02/22/2024 10:43 AM

## 2024-02-22 NOTE — H&P (Signed)
 Preoperative History and Physical  Alison Logan is a 48 y.o. No obstetric history on file. here for surgical management of menorrhagia with regular cycle, fibroid uterus.   No significant preoperative concerns.  History of Present Illness: Alison Logan is a 48 year old female with uterine fibroids who presents with heavy menstrual bleeding and pelvic pain.   She has experienced progressively heavier menstrual periods over the past year, accompanied by increasing pelvic and back pain. The bleeding is described as extremely heavy and worsens each month. She also reports dyspareunia, although it is not necessarily accompanied by bleeding.   An ultrasound showed uterine fibroids and two cysts (follicles < 3 cm) on the ovaries. She has previously tried various birth control methods, including IUDs and birth control pills, which caused adverse effects, and she is not interested in pursuing hormonal treatments again.   There is a significant family history of ovarian cancer on the maternal side, including her mother, grandmothers, and aunts. She underwent genetic testing approximately six to seven years ago, which was negative for pathogenic variants, including BRCA1 and BRCA2.   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 Historically, she has low blood pressure, which is noted to be higher than usual today, possibly due to recent dietary choices.     Last pap smear 12/19/2023: NILM, HPV negative  Endometrial biopsy 01/23/2024: SECRETORY ENDOMETRIUM. NO EVIDENCE OF HYPERPLASIA, ENDOMETRITIS OR ATYPIA.    Pelvic ultrasound report 01/06/2024: Uterus ======  Visualized. Size 98 mm x 79 mm x 57 mm Normal Position: anteverted Malformations: none Myometrium: appears normal Endometrium: appears normal/appropriate. Endometrial thickness, total 11.9 mm Fibroid(s) Intramural. Left lateral. Homogeneous structure. Size 41.00 mm x 41.00 mm x 39 mm. Mean 40.3 mm. Vol 34.3  cm. Doppler: color score 2 (minimal color)   Right Ovary =========  Visualized, Normal. Outline: Normal. Morphology: Appropriate. Size 35 mm x 35 mm x 24 mm Follicles identified Cyst(s)    1.  Simple cyst. Size 23.0 mm x 23.0 mm x 22 mm. Mean 22.7 mm. Vol 6.094 cm        2.  Simple cyst. Size 18.0 mm x 17.0 mm x 16 mm. Mean 17.0 mm. Vol 2.564 cm   Right Tube =========  Not visualized   Left Ovary ========  Visualized, Normal. Outline: Normal. Morphology: Appropriate. Size 36 mm x 26 mm x 20 mm No cysts identified Follicles identified   Left Tube ========  Not visualized   Cul de Sac =========  Normal. No free fluid visualized   Impression =========  Endovaginal ultrasound performed today due to the indications outlined above.   The sonogram reveals a anteverted uterus. Large intramural fibroid as noted above. Fibroid does not appear to displace endocavitary margins.   The endometrium appears homogenous in thickness and echotexture with normal vascularity.   The ovaries were visualized bilaterally. Two simple anechoic cysts seen within right ovary. Left ovary appears unremarkable.   There are no unusual adnexal findings.   There is no free fluid.  Proposed surgery: Robot assisted total laparoscopic hysterectomy, bilateral salpingectomy, cystoscopy  Past Medical History:  Diagnosis Date   Allergy    Anxiety    Asthma    childhood   Chronic low blood pressure    Complication of anesthesia    Fibroid uterus    Former smoker    History of gastroesophageal reflux (GERD)    Menorrhagia with irregular cycle    Migraines  Obesity    Ovarian cyst    Polyp of sigmoid colon    PONV (postoperative nausea and vomiting)    with Laparoscopy   Postoperative hypothyroidism    Psoriatic arthritis (HCC)    Sleep apnea    no CPAP-lost weight and does not have to use   Thyroid  disease    Past Surgical History:  Procedure Laterality Date   COLONOSCOPY WITH  PROPOFOL  N/A 06/26/2021   Procedure: COLONOSCOPY WITH BIOPSY;  Surgeon: Jinny Carmine, MD;  Location: [facility];  Service: Endoscopy;  Laterality: N/A;   LAPAROSCOPY  1997   NASAL SINUS SURGERY  2018   POLYPECTOMY N/A 06/26/2021   Procedure: POLYPECTOMY;  Surgeon: Jinny Carmine, MD;  Location: [facility];  Service: Endoscopy;  Laterality: N/A;   RHINOPLASTY  2018   THYROIDECTOMY  2012   WISDOM TOOTH EXTRACTION Bilateral 1996   OB History  No obstetric history on file.  Patient denies any other pertinent gynecologic issues.   No current facility-administered medications on file prior to encounter.   Current Outpatient Medications on File Prior to Encounter  Medication Sig Dispense Refill   celecoxib  (CELEBREX ) 100 MG capsule TAKE 1 CAPSULE (100 MG TOTAL) BY MOUTH 2 (TWO) TIMES DAILY. X 1 WEEK THEN TWICE A DAY AS NEEDED 180 capsule 0   Cholecalciferol (VITAMIN D -3) 125 MCG (5000 UT) TABS Take 5,000 Units by mouth daily.     ENBREL SURECLICK 50 MG/ML injection Inject 50 mg into the skin every Monday. Night     gabapentin  (NEURONTIN ) 100 MG capsule TAKE 1-2 CAPSULES (100-200 MG TOTAL) BY MOUTH AT BEDTIME AS NEEDED. 90 capsule 0   levothyroxine (SYNTHROID) 88 MCG tablet Take 44-88 mcg by mouth daily before breakfast. Take 44 mcg on Sunday, All other days take 88 mcg     liothyronine (CYTOMEL) 5 MCG tablet Take 5 mcg by mouth every morning.     OVER THE COUNTER MEDICATION Take 4,500 mg by mouth daily. nitric oxide 2250 mg each     UBRELVY  100 MG TABS Take 1 tablet (100 mg total) by mouth daily as needed. 30 tablet 2   Allergies  Allergen Reactions   Aspartame Other (See Comments)    Headaches   Tree Extract Anaphylaxis    Tree nuts    Social History:   reports that she quit smoking about 2 years ago. Her smoking use included cigarettes. She started smoking about 10 years ago. She has a 4 pack-year smoking history. She has never used smokeless tobacco. She reports current  alcohol use of about 2.0 standard drinks of alcohol per week. She reports that she does not use drugs.  Family History  Problem Relation Age of Onset   Uterine cancer Mother    Pancreatic cancer Father    Celiac disease Son    Stomach cancer Maternal Aunt    Pancreatic cancer Maternal Uncle    Uterine cancer Maternal Grandmother    Colon cancer Paternal Grandfather     Review of Systems: Noncontributory  PHYSICAL EXAM: Blood pressure 116/69, pulse 72, temperature 98.3 F (36.8 C), temperature source Temporal, resp. rate 16, height 5' 4 (1.626 m), weight 75.8 kg, last menstrual period 01/10/2024, SpO2 98%. CONSTITUTIONAL: Well-developed, well-nourished female in no acute distress.  HENT:  Normocephalic, atraumatic, External right and left ear normal. Oropharynx is clear and moist EYES: Conjunctivae and EOM are normal. Pupils are equal, round, and reactive to light. No scleral icterus.  NECK: Normal range of motion, supple,  no masses SKIN: Skin is warm and dry. No rash noted. Not diaphoretic. No erythema. No pallor. NEUROLGIC: Alert and oriented to person, place, and time. Normal reflexes, muscle tone coordination. No cranial nerve deficit noted. PSYCHIATRIC: Normal mood and affect. Normal behavior. Normal judgment and thought content. CARDIOVASCULAR: Normal heart rate noted, regular rhythm RESPIRATORY: Effort and breath sounds normal, no problems with respiration noted ABDOMEN: Soft, nontender, nondistended. PELVIC: Deferred MUSCULOSKELETAL: Normal range of motion. No edema and no tenderness. 2+ distal pulses.  Labs: Results for orders placed or performed during the hospital encounter of 02/22/24 (from the past 2 weeks)  ABO/Rh   Collection Time: 02/22/24  6:22 AM  Result Value Ref Range   ABO/RH(D) PENDING   Pregnancy, urine POC   Collection Time: 02/22/24  6:31 AM  Result Value Ref Range   Preg Test, Ur NEGATIVE NEGATIVE  Results for orders placed or performed during the  hospital encounter of 02/20/24 (from the past 2 weeks)  CBC   Collection Time: 02/20/24  8:18 AM  Result Value Ref Range   WBC 6.1 4.0 - 10.5 K/uL   RBC 4.35 3.87 - 5.11 MIL/uL   Hemoglobin 13.9 12.0 - 15.0 g/dL   HCT 58.8 63.9 - 53.9 %   MCV 94.5 80.0 - 100.0 fL   MCH 32.0 26.0 - 34.0 pg   MCHC 33.8 30.0 - 36.0 g/dL   RDW 87.0 88.4 - 84.4 %   Platelets 325 150 - 400 K/uL   nRBC 0.0 0.0 - 0.2 %  Basic metabolic panel   Collection Time: 02/20/24  8:18 AM  Result Value Ref Range   Sodium 140 135 - 145 mmol/L   Potassium 4.0 3.5 - 5.1 mmol/L   Chloride 105 98 - 111 mmol/L   CO2 25 22 - 32 mmol/L   Glucose, Bld 88 70 - 99 mg/dL   BUN 28 (H) 6 - 20 mg/dL   Creatinine, Ser 8.91 (H) 0.44 - 1.00 mg/dL   Calcium 8.5 (L) 8.9 - 10.3 mg/dL   GFR, Estimated >39 >39 mL/min   Anion gap 10 5 - 15  Type and screen   Collection Time: 02/20/24  8:18 AM  Result Value Ref Range   ABO/RH(D) A POS    Antibody Screen NEG    Sample Expiration 03/05/2024,2359    Extend sample reason      NO TRANSFUSIONS OR PREGNANCY IN THE PAST 3 MONTHS Performed at [facility], [address] [place], [place] [address]     Assessment: Menorrhagia with regular cycle Fibroid Uterus  Plan: Patient will undergo surgical management with the above noted surgery.   The risks of surgery were discussed in detail with the patient including but not limited to: bleeding which may require transfusion or reoperation; infection which may require antibiotics; injury to surrounding organs which may involve bowel, bladder, ureters ; need for additional procedures including laparoscopy or laparotomy; thromboembolic phenomenon, surgical site problems and other postoperative/anesthesia complications. Likelihood of success in alleviating the patient's condition was discussed. Routine postoperative instructions will be reviewed with the patient and her family in detail after surgery.  The patient concurred with the proposed  plan, giving informed written consent for the surgery.  Patient has been NPO since last night she will remain NPO for procedure.  Anesthesia and OR aware.  Preoperative prophylactic antibiotics, as indicated, and SCDs ordered on call to the OR.  To OR when ready.  Garnette Mace, MD 02/22/2024 7:13 AM

## 2024-02-23 ENCOUNTER — Encounter: Payer: Self-pay | Admitting: Obstetrics and Gynecology

## 2024-02-23 NOTE — Telephone Encounter (Signed)
 Discontinued on 02/22/24.  Requested Prescriptions  Pending Prescriptions Disp Refills   celecoxib  (CELEBREX ) 100 MG capsule [Pharmacy Med Name: CELECOXIB  100 MG CAPSULE] 180 capsule 0    Sig: TAKE 1 CAPSULE (100 MG TOTAL) BY MOUTH 2 (TWO) TIMES DAILY. X 1 WEEK THEN TWICE A DAY AS NEEDED     Analgesics:  COX2 Inhibitors Failed - 02/23/2024  3:24 PM      Failed - Manual Review: Labs are only required if the patient has taken medication for more than 8 weeks.      Failed - Cr in normal range and within 360 days    Creatinine, Ser  Date Value Ref Range Status  02/20/2024 1.08 (H) 0.44 - 1.00 mg/dL Final         Failed - AST in normal range and within 360 days    AST  Date Value Ref Range Status  08/26/2022 21 0 - 40 IU/L Final         Failed - ALT in normal range and within 360 days    ALT  Date Value Ref Range Status  08/26/2022 20 0 - 32 IU/L Final         Failed - Valid encounter within last 12 months    Recent Outpatient Visits           4 months ago Cellulitis of left lower extremity   [facility], Mackey POUR, MD   7 months ago Strain of thoracic back region   [facility] at [facility], Selinda PARAS, MD              Passed - HGB in normal range and within 360 days    Hemoglobin  Date Value Ref Range Status  02/20/2024 13.9 12.0 - 15.0 g/dL Final  95/81/7975 84.3 11.1 - 15.9 g/dL Final         Passed - HCT in normal range and within 360 days    HCT  Date Value Ref Range Status  02/20/2024 41.1 36.0 - 46.0 % Final   Hematocrit  Date Value Ref Range Status  08/26/2022 45.7 34.0 - 46.6 % Final         Passed - eGFR is 30 or above and within 360 days    GFR calc Af Amer  Date Value Ref Range Status  03/30/2018 >60 >60 mL/min Final    Comment:    (NOTE) The eGFR has been calculated using the CKD EPI equation. This calculation has not been validated in all  clinical situations. eGFR's persistently <60 mL/min signify possible Chronic Kidney Disease.    GFR, Estimated  Date Value Ref Range Status  02/20/2024 >60 >60 mL/min Final    Comment:    (NOTE) Calculated using the CKD-EPI Creatinine Equation (2021)    eGFR  Date Value Ref Range Status  11/08/2022 69 >59 mL/min/1.73 Final         Passed - Patient is not pregnant

## 2024-02-24 LAB — SURGICAL PATHOLOGY

## 2024-03-12 DIAGNOSIS — K6289 Other specified diseases of anus and rectum: Secondary | ICD-10-CM | POA: Diagnosis not present

## 2024-03-12 DIAGNOSIS — G8918 Other acute postprocedural pain: Secondary | ICD-10-CM | POA: Diagnosis not present

## 2024-03-12 DIAGNOSIS — R1030 Lower abdominal pain, unspecified: Secondary | ICD-10-CM | POA: Diagnosis not present

## 2024-03-13 ENCOUNTER — Other Ambulatory Visit: Payer: Self-pay | Admitting: Obstetrics and Gynecology

## 2024-03-13 ENCOUNTER — Ambulatory Visit
Admission: RE | Admit: 2024-03-13 | Discharge: 2024-03-13 | Disposition: A | Discharge status: Home / Self Care | Source: Ambulatory Visit | Attending: Obstetrics and Gynecology | Admitting: Obstetrics and Gynecology

## 2024-03-13 DIAGNOSIS — R109 Unspecified abdominal pain: Secondary | ICD-10-CM | POA: Diagnosis not present

## 2024-03-13 DIAGNOSIS — R1030 Lower abdominal pain, unspecified: Secondary | ICD-10-CM

## 2024-03-13 DIAGNOSIS — R3129 Other microscopic hematuria: Secondary | ICD-10-CM | POA: Diagnosis not present

## 2024-03-13 DIAGNOSIS — K6289 Other specified diseases of anus and rectum: Secondary | ICD-10-CM | POA: Diagnosis not present

## 2024-03-13 DIAGNOSIS — G8918 Other acute postprocedural pain: Secondary | ICD-10-CM | POA: Insufficient documentation

## 2024-03-13 DIAGNOSIS — Z90710 Acquired absence of both cervix and uterus: Secondary | ICD-10-CM | POA: Diagnosis not present

## 2024-03-13 MED ORDER — IOHEXOL 9 MG/ML PO SOLN
500.0000 mL | ORAL | Status: AC
  Administered 2024-03-13 (×2): 500 mL via ORAL

## 2024-03-13 MED ORDER — IOHEXOL 300 MG/ML  SOLN
100.0000 mL | Freq: Once | INTRAMUSCULAR | Status: AC | PRN
  Administered 2024-03-13: 100 mL via INTRAVENOUS

## 2024-03-22 ENCOUNTER — Encounter: Payer: Self-pay | Admitting: *Deleted

## 2024-03-22 ENCOUNTER — Emergency Department
Admission: EM | Admit: 2024-03-22 | Discharge: 2024-03-23 | Discharge status: Against Medical Advice | Attending: Emergency Medicine | Admitting: Emergency Medicine

## 2024-03-22 ENCOUNTER — Other Ambulatory Visit: Payer: Self-pay

## 2024-03-22 DIAGNOSIS — Z5321 Procedure and treatment not carried out due to patient leaving prior to being seen by health care provider: Secondary | ICD-10-CM | POA: Insufficient documentation

## 2024-03-22 DIAGNOSIS — R1030 Lower abdominal pain, unspecified: Secondary | ICD-10-CM | POA: Diagnosis not present

## 2024-03-22 DIAGNOSIS — R197 Diarrhea, unspecified: Secondary | ICD-10-CM | POA: Diagnosis not present

## 2024-03-22 LAB — COMPREHENSIVE METABOLIC PANEL WITH GFR
ALT: 33 U/L (ref 0–44)
AST: 37 U/L (ref 15–41)
Albumin: 4.3 g/dL (ref 3.5–5.0)
Alkaline Phosphatase: 50 U/L (ref 38–126)
Anion gap: 11 (ref 5–15)
BUN: 22 mg/dL — ABNORMAL HIGH (ref 6–20)
CO2: 25 mmol/L (ref 22–32)
Calcium: 9 mg/dL (ref 8.9–10.3)
Chloride: 102 mmol/L (ref 98–111)
Creatinine, Ser: 1.07 mg/dL — ABNORMAL HIGH (ref 0.44–1.00)
GFR, Estimated: >60 mL/min (ref >60)
Glucose, Bld: 88 mg/dL (ref 70–99)
Potassium: 3.7 mmol/L (ref 3.5–5.1)
Sodium: 138 mmol/L (ref 135–145)
Total Bilirubin: 0.3 mg/dL (ref 0.0–1.2)
Total Protein: 7.6 g/dL (ref 6.5–8.1)

## 2024-03-22 LAB — URINALYSIS, ROUTINE W REFLEX MICROSCOPIC
Bilirubin Urine: NEGATIVE
Glucose, UA: NEGATIVE mg/dL
Ketones, ur: NEGATIVE mg/dL
Nitrite: NEGATIVE
Protein, ur: NEGATIVE mg/dL
Specific Gravity, Urine: 1.023 (ref 1.005–1.030)
pH: 5 (ref 5.0–8.0)

## 2024-03-22 LAB — CBC
HCT: 40.7 % (ref 36.0–46.0)
Hemoglobin: 13.5 g/dL (ref 12.0–15.0)
MCH: 31.5 pg (ref 26.0–34.0)
MCHC: 33.2 g/dL (ref 30.0–36.0)
MCV: 94.9 fL (ref 80.0–100.0)
Platelets: 376 K/uL (ref 150–400)
RBC: 4.29 MIL/uL (ref 3.87–5.11)
RDW: 12.1 % (ref 11.5–15.5)
WBC: 10 K/uL (ref 4.0–10.5)
nRBC: 0 % (ref 0.0–0.2)

## 2024-03-22 LAB — LIPASE, BLOOD: Lipase: 96 U/L — ABNORMAL HIGH (ref 11–51)

## 2024-03-22 NOTE — ED Triage Notes (Signed)
 Pt ambulatory to triage.  Pt had a hysterectomy 02/22/2024 and continues to have lower abd pain.  Pt reports diarrhea today.  No vomiting.  Pt alert  speech clear.

## 2024-03-23 ENCOUNTER — Other Ambulatory Visit: Payer: Self-pay

## 2024-03-23 ENCOUNTER — Telehealth: Payer: Self-pay | Admitting: Emergency Medicine

## 2024-03-23 ENCOUNTER — Emergency Department
Admission: EM | Admit: 2024-03-23 | Discharge: 2024-03-23 | Disposition: A | Discharge status: Home / Self Care | Source: Home / Self Care | Attending: Emergency Medicine | Admitting: Emergency Medicine

## 2024-03-23 DIAGNOSIS — R197 Diarrhea, unspecified: Secondary | ICD-10-CM | POA: Diagnosis not present

## 2024-03-23 DIAGNOSIS — R1030 Lower abdominal pain, unspecified: Secondary | ICD-10-CM | POA: Insufficient documentation

## 2024-03-23 LAB — CBC
HCT: 41.7 % (ref 36.0–46.0)
Hemoglobin: 14.1 g/dL (ref 12.0–15.0)
MCH: 31.5 pg (ref 26.0–34.0)
MCHC: 33.8 g/dL (ref 30.0–36.0)
MCV: 93.1 fL (ref 80.0–100.0)
Platelets: 393 K/uL (ref 150–400)
RBC: 4.48 MIL/uL (ref 3.87–5.11)
RDW: 12.2 % (ref 11.5–15.5)
WBC: 7.8 K/uL (ref 4.0–10.5)
nRBC: 0 % (ref 0.0–0.2)

## 2024-03-23 LAB — GASTROINTESTINAL PANEL BY PCR, STOOL (REPLACES STOOL CULTURE)

## 2024-03-23 LAB — C DIFFICILE QUICK SCREEN W PCR REFLEX
C Diff antigen: NEGATIVE
C Diff interpretation: NOT DETECTED
C Diff toxin: NEGATIVE

## 2024-03-23 LAB — URINALYSIS, ROUTINE W REFLEX MICROSCOPIC
Bilirubin Urine: NEGATIVE
Glucose, UA: NEGATIVE mg/dL
Ketones, ur: NEGATIVE mg/dL
Nitrite: NEGATIVE
Protein, ur: NEGATIVE mg/dL
Specific Gravity, Urine: 1.017 (ref 1.005–1.030)
pH: 6 (ref 5.0–8.0)

## 2024-03-23 LAB — COMPREHENSIVE METABOLIC PANEL WITH GFR
ALT: 31 U/L (ref 0–44)
AST: 37 U/L (ref 15–41)
Albumin: 4.3 g/dL (ref 3.5–5.0)
Alkaline Phosphatase: 48 U/L (ref 38–126)
Anion gap: 8 (ref 5–15)
BUN: 16 mg/dL (ref 6–20)
CO2: 26 mmol/L (ref 22–32)
Calcium: 8.9 mg/dL (ref 8.9–10.3)
Chloride: 102 mmol/L (ref 98–111)
Creatinine, Ser: 1.03 mg/dL — ABNORMAL HIGH (ref 0.44–1.00)
GFR, Estimated: >60 mL/min (ref >60)
Glucose, Bld: 93 mg/dL (ref 70–99)
Potassium: 4 mmol/L (ref 3.5–5.1)
Sodium: 137 mmol/L (ref 135–145)
Total Bilirubin: 0.4 mg/dL (ref 0.0–1.2)
Total Protein: 7.5 g/dL (ref 6.5–8.1)

## 2024-03-23 LAB — LIPASE, BLOOD: Lipase: 53 U/L — ABNORMAL HIGH (ref 11–51)

## 2024-03-23 MED ORDER — LORAZEPAM 1 MG PO TABS
1.0000 mg | ORAL_TABLET | Freq: Once | ORAL | Status: AC
  Administered 2024-03-23: 1 mg via ORAL
  Filled 2024-03-23: qty 1

## 2024-03-23 MED ORDER — LOPERAMIDE HCL 2 MG PO CAPS
2.0000 mg | ORAL_CAPSULE | Freq: Once | ORAL | Status: AC
  Administered 2024-03-23: 2 mg via ORAL
  Filled 2024-03-23: qty 1

## 2024-03-23 MED ORDER — LORAZEPAM 0.5 MG PO TABS: 0.5000 mg | ORAL_TABLET | Freq: Every day | ORAL | 0 refills | Status: AC

## 2024-03-23 NOTE — Discharge Instructions (Addendum)
 Your exam and labs are overall normal.  Your labs are trending normal today.  You should continue to monitor your symptoms and eat a carb rich diet to help reduce loose stools.  Consider a probiotic as well as your pain medicine to help with pain and diarrhea.  Follow your pending C. difficile result on MyChart.  Follow-up with Dr. Leonce or return to the ED if needed.

## 2024-03-23 NOTE — Telephone Encounter (Signed)
 Called patient due to left emergency department before provider exam to inquire about condition and follow up plans.  Says she continues to have the diarrhea after drinking anything.  Says this has been going on since surgery. She is on antibiotics.  Doesn't think she was ever tested for c diff.  Her pcp is trying to get her into GI, and told her to come here.  She has seen the elevated lipase.  She says she will come back.

## 2024-03-23 NOTE — ED Triage Notes (Signed)
 Patient states she had a hysterectomy on 02/22/2024 and continues to have abdominal pain, nausea and diarrhea. Was here yesterday but left from waiting room due to wait time. States she was called and told to come back.

## 2024-03-23 NOTE — ED Notes (Signed)
 Patient unable to be located for room placement at 2313 11/13 and now. Assumed to have left without notifying staff.

## 2024-03-23 NOTE — ED Provider Notes (Signed)
 [facility] Emergency Department Provider Note     Event Date/Time   First MD Initiated Contact with Patient 03/23/24 1321     (approximate)   History   Abdominal Pain   HPI  Alison Logan is a 48 y.o. female 4 weeks s/p lap total hysterectomy, returns to the ED endorsing ongoing watery diarrhea and abdominal discomfort.  Patient is also endorsing some intermittent nausea and vomiting.  No reports of any fevers, chills, sweats.  Patient presented to the ED yesterday, but left prior to completing care, due to the protracted wait.  She was notified by telephone of some lab abnormalities, and advised her to return to the ED.  Patient endorses at least 21 episode yesterday of loose watery stools.  She had a CT scan in the postop setting on 03/13/2024, ordered by Elspeth Mace (OB G), which showed evidence of stranding in the pelvic fat with mid sigmoid colon wall thickening concerning for postop changes versus acute diverticulitis.  No focal abscess was appreciated.  Patient was started on metronidazole and Cipro empirically.  She has 1 more dose of that regimen, but returns to the ED due to the persistent diarrhea.   Physical Exam   Triage Vital Signs: ED Triage Vitals [03/23/24 1150]  Encounter Vitals Group     BP 120/79     Girls Systolic BP Percentile      Girls Diastolic BP Percentile      Boys Systolic BP Percentile      Boys Diastolic BP Percentile      Pulse Rate 91     Resp 18     Temp 97.9 F (36.6 C)     Temp Source Oral     SpO2 100 %     Weight 170 lb (77.1 kg)     Height 5' 4 (1.626 m)     Head Circumference      Peak Flow      Pain Score 8     Pain Loc      Pain Education      Exclude from Growth Chart     Most recent vital signs: Vitals:   03/23/24 1150 03/23/24 1705  BP: 120/79 109/61  Pulse: 91 66  Resp: 18 16  Temp: 97.9 F (36.6 C) 97.8 F (36.6 C)  SpO2: 100% 98%    General Awake, no distress. NAD HEENT NCAT.  PERRL. EOMI. No rhinorrhea. Mucous membranes are moist.  CV:  Good peripheral perfusion. RRR RESP:  Normal effort. CTA ABD:  No distention.  Normal bowel sounds x 4.  No rebound, guarding, or rigidity noted.   ED Results / Procedures / Treatments   Labs (all labs ordered are listed, but only abnormal results are displayed) Labs Reviewed  LIPASE, BLOOD - Abnormal; Notable for the following components:      Result Value   Lipase 53 (*)    All other components within normal limits  COMPREHENSIVE METABOLIC PANEL WITH GFR - Abnormal; Notable for the following components:   Creatinine, Ser 1.03 (*)    All other components within normal limits  URINALYSIS, ROUTINE W REFLEX MICROSCOPIC - Abnormal; Notable for the following components:   Color, Urine YELLOW (*)    APPearance CLEAR (*)    Hgb urine dipstick MODERATE (*)    Leukocytes,Ua SMALL (*)    Bacteria, UA RARE (*)    All other components within normal limits  GASTROINTESTINAL PANEL BY PCR, STOOL (REPLACES STOOL CULTURE)  C DIFFICILE QUICK SCREEN W PCR REFLEX    CBC     EKG   RADIOLOGY   No results found.   PROCEDURES:  Critical Care performed: No  Procedures   MEDICATIONS ORDERED IN ED: Medications  LORazepam (ATIVAN) tablet 1 mg (1 mg Oral Given 03/23/24 1906)  loperamide (IMODIUM) capsule 2 mg (2 mg Oral Given 03/23/24 1906)     IMPRESSION / MDM / ASSESSMENT AND PLAN / ED COURSE  I reviewed the triage vital signs and the nursing notes.                              Differential diagnosis includes, but is not limited to, acute appendicitis, diverticulitis, urinary tract infection/pyelonephritis, ileus, bowel obstruction, colitis, renal colic, gastroenteritis, hernia, fibroids, etc.  Patient's presentation is most consistent with acute complicated illness / injury requiring diagnostic workup.  Patient's diagnosis is consistent with diarrhea of an unclear etiology as well as some lower abdominal pain in the  postop setting.  Patient presents to the ED in no acute distress.  She is afebrile, tolerating p.o., but endorsing ongoing loose stools.  Labs overall reassuring with no acute abnormalities appreciated.  No critical anemia or leukocytosis noted.  Stool PCR and viral panel test are negative for any acute findings.  We discussed options including repeat CT imaging, but patient declined at this time, noting overall being stable.  Patient will be discharged home with prescriptions for lorazepam and instructions to take OT see Imodium as directed. Patient is to follow up with her PCP or OBG as discussed, as needed or otherwise directed. Patient is given ED precautions to return to the ED for any worsening or new symptoms.   FINAL CLINICAL IMPRESSION(S) / ED DIAGNOSES   Final diagnoses:  Diarrhea, unspecified type     Rx / DC Orders   ED Discharge Orders          Ordered    LORazepam (ATIVAN) 0.5 MG tablet  Daily at bedtime        03/23/24 1910             Note:  This document was prepared using Dragon voice recognition software and may include unintentional dictation errors.    Loyd Candida LULLA Aldona, PA-C 03/23/24 1911    Jacolyn Pae, MD 03/23/24 1956

## 2024-03-28 DIAGNOSIS — Z9889 Other specified postprocedural states: Secondary | ICD-10-CM | POA: Diagnosis not present

## 2024-03-28 DIAGNOSIS — G8918 Other acute postprocedural pain: Secondary | ICD-10-CM | POA: Diagnosis not present

## 2024-03-28 DIAGNOSIS — R112 Nausea with vomiting, unspecified: Secondary | ICD-10-CM | POA: Diagnosis not present

## 2024-03-28 DIAGNOSIS — R1030 Lower abdominal pain, unspecified: Secondary | ICD-10-CM | POA: Diagnosis not present

## 2024-03-28 DIAGNOSIS — R197 Diarrhea, unspecified: Secondary | ICD-10-CM | POA: Diagnosis not present

## 2024-03-29 ENCOUNTER — Encounter: Payer: Self-pay | Admitting: Family Medicine

## 2024-03-29 ENCOUNTER — Ambulatory Visit (INDEPENDENT_AMBULATORY_CARE_PROVIDER_SITE_OTHER): Admitting: Family Medicine

## 2024-03-29 VITALS — BP 124/72 | HR 84 | Ht 64.0 in | Wt 180.0 lb

## 2024-03-29 DIAGNOSIS — K5792 Diverticulitis of intestine, part unspecified, without perforation or abscess without bleeding: Secondary | ICD-10-CM | POA: Diagnosis not present

## 2024-03-29 DIAGNOSIS — L4050 Arthropathic psoriasis, unspecified: Secondary | ICD-10-CM | POA: Diagnosis not present

## 2024-03-29 DIAGNOSIS — K449 Diaphragmatic hernia without obstruction or gangrene: Secondary | ICD-10-CM | POA: Diagnosis not present

## 2024-03-29 NOTE — Assessment & Plan Note (Addendum)
 Plan  Hiatal hernia Identified on CT scan, not related to current diverticulitis symptoms. No immediate intervention required. - Monitor symptoms and follow up with GI for this.

## 2024-03-29 NOTE — Assessment & Plan Note (Signed)
 Immunosuppression due to Enbrel therapy for psoriatic arthritis Concerns about immunosuppression affecting blood work accuracy. Stool culture negative, reducing likelihood of infection. Discussed continuing Enbrel therapy pending rheumatologist's input. - Continue Enbrel therapy as prescribed if approved by your rheumatologist. - Schedule appointment with rheumatologist for management discussion.

## 2024-04-07 NOTE — Progress Notes (Signed)
 Primary Care / Sports Medicine Office Visit  Patient Information:  Patient ID: Alison Logan, female DOB: December 09, 1975 Age: 48 y.o. MRN: [id number]   Alison Logan is a pleasant 48 y.o. female presenting with the following:  Chief Complaint  Patient presents with   Hospitalization Follow-up    Patient presents for a follow up from her recent ER visit on 03/23/24. She has not been feeling well. She has been having abdominal pain that radiates into her rectum. She had CT which showed diverticulitis. She was on ABX and liquid diet when she felt better on liquid diet. Soon as she went back to solid foods abdominal pain and diarrhea came back.     Vitals:   03/29/24 0859  BP: 124/72  Pulse: 84  SpO2: 98%   Vitals:   03/29/24 0859  Weight: 180 lb (81.6 kg)  Height: 5' 4 (1.626 m)   Body mass index is 30.9 kg/m.  CT ABDOMEN PELVIS W CONTRAST Result Date: 03/13/2024 EXAM: CT ABDOMEN AND PELVIS WITH CONTRAST 03/13/2024 02:16:37 PM TECHNIQUE: CT of the abdomen and pelvis was performed with the administration of 100 mL of iohexol  (OMNIPAQUE ) 300 MG/ML solution. Multiplanar reformatted images are provided for review. Automated exposure control, iterative reconstruction, and/or weight-based adjustment of the mA/kV was utilized to reduce the radiation dose to as low as reasonably achievable. COMPARISON: Abdominal radiographs dated 01/24/2023 and CT abdomen and pelvis dated 01/26/2023. CLINICAL HISTORY: Mid-pubic pain and pressure with rectal pain after a partial hysterectomy on 02/22/24. Microscopic hematuria. FINDINGS: LOWER CHEST: Small esophageal hiatal hernia. LIVER: Mild fatty infiltration of the liver. GALLBLADDER AND BILE DUCTS: Gallbladder is unremarkable. No biliary ductal dilatation. SPLEEN: No acute abnormality. PANCREAS: No acute abnormality. ADRENAL GLANDS: No acute abnormality. KIDNEYS, URETERS AND BLADDER: No stones in the kidneys or ureters. No hydronephrosis. No perinephric  or periureteral stranding. Urinary bladder is unremarkable. GI AND BOWEL: Stomach demonstrates no acute abnormality. Contrast material flows to the colon indicating no evidence of bowel obstruction. Stranding in the pelvic fat with suggestion of mild thickening of the sigmoid colon. This could indicate superimposed acute diverticulitis. No loculated collection to suggest an abscess. PERITONEUM AND RETROPERITONEUM: Small amount of free fluid in the pelvis. Stranding and fluid may be related to recent surgery. No free air. VASCULATURE: Aorta is normal in caliber. LYMPH NODES: No lymphadenopathy. REPRODUCTIVE ORGANS: Right adnexal cyst measuring 3.6 cm in diameter. BONES AND SOFT TISSUES: No acute osseous abnormality. No focal soft tissue abnormality. IMPRESSION: 1. Stranding in the pelvic fat with mild sigmoid colon wall thickening, which may reflect postoperative change versus acute diverticulitis; no loculated collection to suggest abscess. 2. Right adnexal simple-appearing cyst measuring 3.6 cm; if premenopausal, no follow-up recommended. If postmenopausal, recommend pelvic ultrasound follow-up in 612 months. Electronically signed by: Elsie Gravely MD 03/13/2024 02:41 PM EST RP Workstation: [id number]     Discussed the use of AI scribe software for clinical note transcription with the patient, who gave verbal consent to proceed.   Independent interpretation of notes and tests performed by another provider:   None  Procedures performed:   None  Pertinent History, Exam, Impression, and Recommendations:   History of Present Illness Alison Logan is a 48 year old female with psoriatic arthritis who presents with post-operative abdominal pain and diarrhea.  Post-operative abdominal pain - Severe, stabbing pain began three days after laparoscopic surgery - Pain localized to lower abdomen with radiation to the buttocks - Pain initially attributed to post-laparoscopic  gas but persisted beyond  expected duration - No associated nausea or blood in stool - Pain-related insomnia, managed with lorazepam   Diarrhea - Onset following laparoscopic surgery - Stool described as watery, 'swamp water ' - Frequency of bowel movements approximately seven to eight times daily, previously higher - No blood in stool - No associated nausea - Stool sample obtained during ER visit was negative for infection, including C. difficile - Maintaining hydration with water  and coconut water  - Taking probiotics  Imaging and laboratory findings - CT scan on November 4th revealed hiatal hernia and bowel wall thickening - Completed 10-day course of metronidazole and ciprofloxacin - Adhered to a liquid diet for five days post-operatively - Concern regarding accuracy of blood work due to weekly Enbrel (immunosuppressant) use - Family member with pharmaceutical background expressed concern for possible untreated infection despite negative stool studies  Autoimmune disease concerns - Current diagnosis of psoriatic arthritis, managed with weekly Enbrel - Concern for possible misdiagnosis, considering celiac disease or another autoimmune condition  Physical Exam ABDOMEN: Abdomen is soft and nondistended with hyperactive bowel sounds. Tenderness is present without rebound in the left lower quadrant, left upper quadrant, epigastrium, and right lower quadrant.  Results LABS Stool culture: Negative for Campylobacter, Shigella, Salmonella, Yersinia, Vibrio, C. difficile  RADIOLOGY Abdominal CT: Hiatal hernia, thickened bowel wall indicating diverticulitis, stranding in pelvic fat, mild sigmoid colon wall thickening, no loculated collections, right adnexal cyst (03/13/2024)  Assessment and Plan Acute diverticulitis of sigmoid colon with associated diarrhea and abdominal pain Postoperative CT showed mild sigmoid colon wall thickening, indicating acute diverticulitis. Stool culture negative, suggesting malabsorption  or maldigestion. Differential includes post-surgical complications or antibiotic-related malabsorption. - Continue metronidazole 500 mg TID for 10 days. - Continue ciprofloxacin 500 mg BID for 10 days. - Maintain liquid diet with easily digestible foods. - Start probiotics to restore gut flora. - Use Imodium  for diarrhea control. - Consider Metamucil or Citrucel for stool regulation. - Keep GI appointment for further evaluation, including potential colonoscopy. - Monitor for red flag symptoms: fever, severe pain, vomiting blood.  Immunosuppression due to Enbrel therapy for psoriatic arthritis Concerns about immunosuppression. Discussed continuing Enbrel therapy pending rheumatologist's input. - Continue Enbrel therapy as prescribed. - Schedule appointment with rheumatologist for management discussion.  Hiatal hernia Identified on CT scan, not related to current diverticulitis symptoms. No immediate intervention required. - Monitor symptoms and follow up with GI if needed.  Problem List Items Addressed This Visit     Acute diverticulitis - Primary   Hiatal hernia   Plan Hiatal hernia Identified on CT scan, not related to current diverticulitis symptoms. No immediate intervention required. - Monitor symptoms and follow up with GI for this.      Psoriatic arthritis (HCC)   Immunosuppression due to Enbrel therapy for psoriatic arthritis Concerns about immunosuppression affecting blood work accuracy. Stool culture negative, reducing likelihood of infection. Discussed continuing Enbrel therapy pending rheumatologist's input. - Continue Enbrel therapy as prescribed if approved by your rheumatologist. - Schedule appointment with rheumatologist for management discussion.      A total of 34 minutes was spent on the date of service, 04/07/2024, encompassing both face-to-face and non-face-to-face time. This included review of prior records and imaging (e.g., MRI and/or radiographs), medical  chart review, information gathering, documentation, care coordination with clinic staff, discussion and counseling with the patient regarding clinical findings and treatment options, and planning for follow-up and next steps in management.   Orders & Medications Medications: No orders of the defined  types were placed in this encounter.  No orders of the defined types were placed in this encounter.    No follow-ups on file.     Selinda JINNY Ku, MD, [facility]   Primary Care Sports Medicine Primary Care and Sports Medicine at [facility]

## 2024-04-07 NOTE — Patient Instructions (Signed)
 VISIT SUMMARY:  During your visit, we discussed your post-operative abdominal pain and diarrhea following laparoscopic surgery. We reviewed your imaging and lab results, and addressed concerns related to your psoriatic arthritis and immunosuppressive therapy.  YOUR PLAN:  ACUTE DIVERTICULITIS OF SIGMOID COLON WITH ASSOCIATED DIARRHEA AND ABDOMINAL PAIN: Your CT scan showed mild thickening of the sigmoid colon wall. This may be causing your abdominal pain and diarrhea. -Continue taking metronidazole 500 mg three times a day for 10 days. -Continue taking ciprofloxacin 500 mg twice a day for 10 days. -Maintain a liquid diet with easily digestible foods. -Start taking probiotics to help restore your gut flora. -Use Imodium  to help control diarrhea. -Consider using Metamucil or Citrucel to help regulate your stool. -Keep your GI appointment for further evaluation, including a potential colonoscopy. -Monitor for any red flag symptoms such as fever, severe pain, or vomiting blood, and seek immediate medical attention if they occur.  IMMUNOSUPPRESSION DUE TO ENBREL THERAPY FOR PSORIATIC ARTHRITIS: You are currently being treated with Enbrel for psoriatic arthritis. -Continue taking Enbrel as prescribed. -Schedule an appointment with your rheumatologist to discuss the management of your condition.  HIATAL HERNIA: A hiatal hernia was identified on your CT scan. -Monitor your symptoms and follow up with your GI specialist if needed.

## 2024-04-09 ENCOUNTER — Encounter: Payer: Self-pay | Admitting: Surgery

## 2024-04-09 ENCOUNTER — Ambulatory Visit: Admitting: Surgery

## 2024-04-09 VITALS — BP 123/84 | HR 98 | Ht 64.0 in | Wt 178.0 lb

## 2024-04-09 DIAGNOSIS — K6289 Other specified diseases of anus and rectum: Secondary | ICD-10-CM | POA: Diagnosis not present

## 2024-04-09 DIAGNOSIS — R102 Pelvic and perineal pain unspecified side: Secondary | ICD-10-CM | POA: Diagnosis not present

## 2024-04-09 DIAGNOSIS — K5792 Diverticulitis of intestine, part unspecified, without perforation or abscess without bleeding: Secondary | ICD-10-CM

## 2024-04-09 DIAGNOSIS — K449 Diaphragmatic hernia without obstruction or gangrene: Secondary | ICD-10-CM

## 2024-04-09 NOTE — Progress Notes (Signed)
 Postoperative Follow-up Patient presents post op from robot assisted total laparoscopic hysterectomy, bilateral salpingectomy, and cystoscopy on 02/22/2024 for menorrhagia with regular cycle, fibroid uterus, left ovarian cyst.  History of Present Illness Alison Logan is a 48 year old female who presents for a postoperative follow-up visit after recent surgery.  She temporarily discontinued Enbrel due to concerns about its impact on her blood count. After stopping Enbrel, her condition improved within three days. She completed a ten-day course of antibiotics for a bladder infection, which she initially delayed taking. She is hopeful that her symptoms will not return after her last dose taken last night.  She experiences internal sensitivity at one of her incision sites, which began about a week ago. The sensitivity is noticeable when bending over, causing mild pain. She speculates it might be due to scar tissue or muscle spasms. No fever, chills, or significant changes in bowel or urinary habits.  No vaginal bleeding except for minor postoperative spotting. The spotting was minimal and only noticeable when wiping.  A CT scan previously detected a hiatal hernia, which she finds unusual. She has had upper endoscopies in the past with varying results regarding the presence of a hiatal hernia. She does not report any significant symptoms related to this finding.   PATHOLOGY REPORT: FINAL DIAGNOSIS        1. Uterus, cervix and bilateral fallopian tubes,  :       CERVIX:       UNREMARKABLE.       NEGATIVE FOR DYSPLASIA OR MALIGNANCY.       ENDOCERVIX:       NABOTHIAN CYSTS.       NEGATIVE FOR HYPERPLASIA, ATYPIA OR MALIGNANCY.       ENDOMETRIUM:       BENIGN SECRETORY ENDOMETRIUM.       NEGATIVE FOR HYPERPLASIA, ATYPIA OR MALIGNANCY.             MYOMETRIUM:       LEIOMYOMATA.       ADENOMYOSIS.       NEGATIVE FOR MALIGNANCY.       SEROSA:       UNREMARKABLE.       NEGATIVE FOR  MALIGNANCY.             BILATERAL FALLOPIAN TUBES:       BENIGN FIMBRIATED FALLOPIAN TUBES WITH PERI-TUBAL CYSTS.       NEGATIVE FOR MALIGNANCY.              2. Ovary, cyst, left :       BENIGN OVARIAN PARENCHYMA WITH INCLUSION CYSTS AND LEYDIG CELL HYPERPLASIA.       NEGATIVE FOR MALIGNANCY.   Weight 228.4 grams  Objective: Vital Signs: BP 108/61   Pulse 66   Ht 162.6 cm (5' 4)   Wt 81.7 kg (180 lb 3.2 oz)   BMI 30.93 kg/m  Physical Exam Constitutional:      Appearance: Normal appearance.  Genitourinary:     No lesions in the vagina.     Right Labia: No rash, tenderness, lesions or skin changes.    Left Labia: No tenderness, lesions, skin changes or rash.    Pelvic Tanner Score: 5/5.    Vaginal cuff intact.    Perineal sutures intact.    No vaginal discharge, erythema, tenderness, bleeding, ulceration, granulation tissue or cuff induration.     No vaginal prolapse present.    No vaginal atrophy present.     Right Adnexa: not tender,  not full and no mass present.    Left Adnexa: not tender, not full and no mass present.    Cervix is absent.     Uterus is absent.     Pelvic exam was performed with patient in the lithotomy position.  HENT:     Head: Normocephalic and atraumatic.  Eyes:     General: No scleral icterus.    Conjunctiva/sclera: Conjunctivae normal.  Pulmonary:     Effort: Pulmonary effort is normal. No respiratory distress.  Abdominal:     General: There is no distension.     Palpations: Abdomen is soft. There is no mass.     Tenderness: There is no abdominal tenderness. There is no guarding or rebound.     Hernia: There is no hernia in the left inguinal area or right inguinal area.     Comments: Incisions: without erythema, induration, warmth, and tenderness. They are clean, dry, and intact.    Musculoskeletal:        General: No swelling. Normal range of motion.  Neurological:     General: No focal deficit present.     Mental Status: She is alert  and oriented to person, place, and time.     Cranial Nerves: No cranial nerve deficit.  Skin:    General: Skin is warm and dry.     Findings: No lesion.  Psychiatric:        Mood and Affect: Mood normal.        Behavior: Behavior normal.        Judgment: Judgment normal.    PE Chaperone note: A chaperone was included for sensitive portions of the exam- staff member name: Bryce Jubilee, CMA.  Chaperone present for pelvic exam.    Assessment & Plan Postoperative incision site sensitivity Internal sensitivity at the incision site, possibly due to scar tissue or muscle spasms. No signs of hernia at the incision site. CT scan indicated a small hiatal hernia. Sutures are still present but expected to dissolve over time. - Continue to monitor incision site for changes or signs of hernia. - Resume intercourse gently after six weeks post-surgery.  Lower abdominal pain and altered bowel habits No current issues with bowel movements or urination. No fever or chills reported. Postoperative vaginal bleeding was minimal and resolved. - Continue to monitor bowel habits and urinary function.  Hiatal hernia Detected on CT scan. Small hiatal hernia not causing significant symptoms. No immediate intervention required unless symptoms develop. - Continue to monitor for symptoms of reflux or other complications.   Return in about 1 year (around 04/09/2025) for Annual Gyn Exam.     Attestation Statement:   I personally performed the service. (TP)  STEPHEN TORIBIO MACE, MD  [facility] 04/09/2024 10:03 AM

## 2024-04-09 NOTE — Patient Instructions (Addendum)
 We have scheduled you for a CT Scan of your Abdomen and Pelvis with contrast. [facility] . Please arrive there by 9am . If you need to reschedule your Scan, you may do so by calling [phone]. Please let us  know if you reschedule your scan as we have to get authorization from your insurance for this.    You also are scheduled for a barium enema, this will be done at [facility] on 04/19/24, please arrive at 8:30am. You will need to prep for this:  Nothing to eat or drink after midnight

## 2024-04-09 NOTE — Progress Notes (Unsigned)
 Patient ID: Alison Logan, female   DOB: 02-07-1976, 48 y.o.   MRN: [id number]  HPI Alison Logan is a 48 y.o. female in consultation at the request of Dr. Leonce she had hysterectomy robotically 6 weeks ago or so.  She says that 3 days after the surgery she develops acute onset of low pelvic pain as well as rectal pain.  The pain was moderate to severe intermittent without any specific aggravating or rating factors.  She did have quite extensive workup including a CT scan of the abdomen pelvis that I personally reviewed showing no evidence of bowel injuries.  There was evidence of some inflammatory changes in the pelvis consistent with recent surgery.  There is evidence of potential changes consistent with diverticulosis and diverticulitis in the sigmoid colon.  There is no abscess there is no perforation. She continues to endorse some intermittent pain.  No fevers no chills she is tolerating diet and having bowel movements.  She came in on her own and is driving and back to most of her activities. Her sistrerr is a teacher, early years/pre and has guided her throughout her postoperative care. He also came to the emergency room and C. difficile was sent that was negative.  She did have diarrhea  HPI  Past Medical History:  Diagnosis Date   Allergy    Anxiety    Asthma    childhood   Chronic low blood pressure    Complication of anesthesia    Fibroid uterus    Former smoker    History of gastroesophageal reflux (GERD)    Menorrhagia with irregular cycle    Migraines    Obesity    Ovarian cyst    Polyp of sigmoid colon    PONV (postoperative nausea and vomiting)    with Laparoscopy   Postoperative hypothyroidism    Psoriatic arthritis (HCC)    Sleep apnea    no CPAP-lost weight and does not have to use   Thyroid  disease     Past Surgical History:  Procedure Laterality Date   ABDOMINAL HYSTERECTOMY     COLONOSCOPY WITH PROPOFOL  N/A 06/26/2021   Procedure: COLONOSCOPY WITH BIOPSY;   Surgeon: Jinny Carmine, MD;  Location: [facility];  Service: Endoscopy;  Laterality: N/A;   CYSTOSCOPY N/A 02/22/2024   Procedure: CYSTOSCOPY;  Surgeon: Leonce Garnette BIRCH, MD;  Location: [facility];  Service: Gynecology;  Laterality: N/A;   HYSTERECTOMY, TOTAL, LAPAROSCOPIC, ROBOT-ASSISTED WITH SALPINGECTOMY Bilateral 02/22/2024   Procedure: HYSTERECTOMY, TOTAL, LAPAROSCOPIC, ROBOT-ASSISTED WITH SALPINGECTOMY;  Surgeon: Leonce Garnette BIRCH, MD;  Location: [facility];  Service: Gynecology;  Laterality: Bilateral;   LAPAROSCOPY  1997   NASAL SINUS SURGERY  2018   POLYPECTOMY N/A 06/26/2021   Procedure: POLYPECTOMY;  Surgeon: Jinny Carmine, MD;  Location: [facility];  Service: Endoscopy;  Laterality: N/A;   RHINOPLASTY  2018   ROBOTIC ASSISTED LAPAROSCOPIC OVARIAN CYSTECTOMY Left 02/22/2024   Procedure: EXCISION, CYST, OVARY, ROBOT-ASSISTED, LAPAROSCOPIC;  Surgeon: Leonce Garnette BIRCH, MD;  Location: [facility];  Service: Gynecology;  Laterality: Left;   THYROIDECTOMY  2012   WISDOM TOOTH EXTRACTION Bilateral 1996    Family History  Problem Relation Age of Onset   Uterine cancer Mother    Pancreatic cancer Father    Celiac disease Son    Stomach cancer Maternal Aunt    Pancreatic cancer Maternal Uncle    Uterine cancer Maternal Grandmother    Colon cancer Paternal Grandfather     Social History Social History  Tobacco Use   Smoking status: Former    Current packs/day: 0.00    Average packs/day: 0.5 packs/day for 8.0 years (4.0 ttl pk-yrs)    Types: Cigarettes    Start date: 05/10/2013    Quit date: 05/10/2021    Years since quitting: 2.9   Smokeless tobacco: Never  Vaping Use   Vaping status: Never Used  Substance Use Topics   Alcohol use: Yes    Alcohol/week: 2.0 standard drinks of alcohol    Types: 2 Standard drinks or equivalent per week    Comment: socially   Drug use: Never    Allergies  Allergen Reactions   Aspartame Other (See Comments)    Headaches    Tree Extract Anaphylaxis    Tree nuts    Current Outpatient Medications  Medication Sig Dispense Refill   ciprofloxacin (CIPRO) 500 MG tablet Take 500 mg by mouth 2 (two) times daily. (Patient not taking: Reported on 04/09/2024)     ENBREL SURECLICK 50 MG/ML injection Inject 50 mg into the skin every Monday. Night     gabapentin  (NEURONTIN ) 100 MG capsule TAKE 1-2 CAPSULES (100-200 MG TOTAL) BY MOUTH AT BEDTIME AS NEEDED. 90 capsule 0   levothyroxine (SYNTHROID) 88 MCG tablet Take 44-88 mcg by mouth daily before breakfast. Take 44 mcg on Sunday, All other days take 88 mcg     liothyronine (CYTOMEL) 5 MCG tablet Take 5 mcg by mouth every morning.     OVER THE COUNTER MEDICATION Take 4,500 mg by mouth daily. nitric oxide 2250 mg each     UBRELVY  100 MG TABS Take 1 tablet (100 mg total) by mouth daily as needed. 30 tablet 2   No current facility-administered medications for this visit.     Review of Systems Full ROS  was asked and was negative except for the information on the HPI  Physical Exam Blood pressure 123/84, pulse 98, height 5' 4 (1.626 m), weight 178 lb (80.7 kg), last menstrual period 02/22/2024, SpO2 98%. CONSTITUTIONAL: NAD. EYES: Pupils are equal, round, Sclera are non-icteric. EARS, NOSE, MOUTH AND THROAT: The oropharynx is clear. The oral mucosa is pink and moist. Hearing is intact to voice. LYMPH NODES:  Lymph nodes in the neck are normal. RESPIRATORY:  Lungs are clear. There is normal respiratory effort, with equal breath sounds bilaterally, and without pathologic use of accessory muscles. CARDIOVASCULAR: Heart is regular without murmurs, gallops, or rubs. GI: The abdomen is  soft, mild tenderness to palpation in the suprapubic area and left lower quadrant without rebound, and nondistended. There are no palpable masses. There is no hepatosplenomegaly. There are normal bowel sounds Biotic incisions are healing well without infection  GU: Rectal deferred.    MUSCULOSKELETAL: Normal muscle strength and tone. No cyanosis or edema.   SKIN: Turgor is good and there are no pathologic skin lesions or ulcers. NEUROLOGIC: Motor and sensation is grossly normal. Cranial nerves are grossly intact. PSYCH:  Oriented to person, place and time. Affect is normal.  Data Reviewed I have personally reviewed the patient's imaging, laboratory findings and medical records.    Assessment/Plan 48 year old female with some abdominal pain questionable colitis versus diverticulitis versus postoperative changes.  Discussed with patient in detail about her disease process.  At this point I am not 100% sure of the etiology of her lower pelvic pain and rectal pain.  I do think that it will be prudent to start workup with a CT scan of the abdomen pelvis with contrast as well  as with a contrast enema.  I will be happy to see her back after the workup is completed.  I had an extensive discussion with the patient about possible etiologies.  She is not toxic and does not rquire surgical intervention I personally spent a total of 55 minutes in the care of the patient today including performing a medically appropriate exam/evaluation, counseling and educating, placing orders, referring and communicating with other health care professionals, documenting clinical information in the EHR, independently interpreting and reviewing images studies and coordinating care.  Copy of this report was sent to the referring provider   Laneta Luna, MD FACS General Surgeon 04/09/2024, 3:09 PM

## 2024-04-11 ENCOUNTER — Ambulatory Visit: Admitting: Surgery

## 2024-04-13 ENCOUNTER — Ambulatory Visit: Admission: RE | Admit: 2024-04-13 | Discharge: 2024-04-13 | Attending: Surgery

## 2024-04-13 DIAGNOSIS — N83201 Unspecified ovarian cyst, right side: Secondary | ICD-10-CM | POA: Diagnosis not present

## 2024-04-13 DIAGNOSIS — K5792 Diverticulitis of intestine, part unspecified, without perforation or abscess without bleeding: Secondary | ICD-10-CM | POA: Diagnosis not present

## 2024-04-13 DIAGNOSIS — K449 Diaphragmatic hernia without obstruction or gangrene: Secondary | ICD-10-CM | POA: Diagnosis not present

## 2024-04-13 DIAGNOSIS — Z90710 Acquired absence of both cervix and uterus: Secondary | ICD-10-CM | POA: Diagnosis not present

## 2024-04-13 DIAGNOSIS — K828 Other specified diseases of gallbladder: Secondary | ICD-10-CM | POA: Diagnosis not present

## 2024-04-13 MED ORDER — IOHEXOL 300 MG/ML  SOLN
100.0000 mL | Freq: Once | INTRAMUSCULAR | Status: AC | PRN
  Administered 2024-04-13: 100 mL via INTRAVENOUS

## 2024-04-13 MED ORDER — IOHEXOL 9 MG/ML PO SOLN
500.0000 mL | ORAL | Status: AC
  Administered 2024-04-13: 500 mL via ORAL

## 2024-04-18 ENCOUNTER — Telehealth: Payer: Self-pay | Admitting: Urology

## 2024-04-18 NOTE — Telephone Encounter (Signed)
°  IR BRIEF PROGRESS NOTE:  Patient was scheduled by Dr. Jordis for water  soluble enema study at [facility] on 12/12 for evaluation of potential colonic missed injuries during recent hysterectomy. Dr. Jordis had also ordered a CT A/P w/ CM for this purpose, which was completed on 12/5, and currently pending read by Radiologist.   After preliminary review of recent CT imaging by Dr. Johann, the CT findings were discussed with Dr. Jordis over Epic Secure chat. Consensus was reached that contrast enema study would provide low yield results as compared to CT imaging. Therefore, contrast enema was canceled. Patient advised by phone by scheduling staff.   Per patient request, this APP reached out to patient to discuss this cancellation. Patient advised of above, and agreeable with current plan. She will await final CT report, and requests review of CT findings once report is finalized.    Electronically Signed: Carlin DELENA Griffon, PA-C 04/18/2024, 4:15 PM

## 2024-04-19 ENCOUNTER — Other Ambulatory Visit

## 2024-04-20 ENCOUNTER — Inpatient Hospital Stay: Admission: RE | Admit: 2024-04-20

## 2024-04-25 ENCOUNTER — Ambulatory Visit: Admitting: Surgery

## 2024-05-21 ENCOUNTER — Telehealth: Payer: Self-pay | Admitting: Surgery

## 2024-05-21 ENCOUNTER — Ambulatory Visit: Admitting: Surgery

## 2024-05-21 ENCOUNTER — Encounter: Payer: Self-pay | Admitting: Surgery

## 2024-05-21 VITALS — BP 110/77 | HR 61 | Temp 98.0°F | Ht 64.0 in | Wt 188.2 lb

## 2024-05-21 DIAGNOSIS — D135 Benign neoplasm of extrahepatic bile ducts: Secondary | ICD-10-CM | POA: Diagnosis not present

## 2024-05-21 DIAGNOSIS — K8010 Calculus of gallbladder with chronic cholecystitis without obstruction: Secondary | ICD-10-CM

## 2024-05-21 NOTE — Progress Notes (Signed)
 Outpatient Surgical Follow Up  05/21/2024  Alison Logan is an 49 y.o. female.   Chief Complaint  Patient presents with   Follow-up    Diverticulitis/hiatal hernia    HPI:  Alison Logan is a 49 y.o. female seen in f/u for abdominal pain. she had hysterectomy robotically 3 months ago or so.  She says that 3 days after the surgery she develops acute onset of low pelvic pain as well as rectal pain.  The pain was moderate to severe intermittent without any specific aggravating or rating factors.  She did have quite extensive workup including a CT scan of the abdomen pelvis that I personally reviewed showing no evidence of bowel injuries.  There was evidence of some inflammatory changes in the pelvis consistent with recent surgery.  There is evidence of potential changes consistent with diverticulosis and diverticulitis in the sigmoid colon.  There is no abscess there is no perforation. I repeated a CT which I have pers reviewed, showing calcification of GB c/w adenomyomatosis, no other acute findings. She continues to endorse some intermittent pain.  No fevers no chills she is tolerating diet and having bowel movements.  She states that the pain is sharp intermittent and radiates to the back in a band like fashion. She does have some stress incontinence that seems to be exacerbated over the last few months   Her sistrerr is a teacher, early years/pre and has guided her throughout her postoperative care. He also came to the emergency room and C. difficile was sent that was negative.  She did have diarrhea  Past Medical History:  Diagnosis Date   Allergy    Anxiety    Asthma    childhood   Chronic low blood pressure    Complication of anesthesia    Fibroid uterus    Former smoker    History of gastroesophageal reflux (GERD)    Menorrhagia with irregular cycle    Migraines    Obesity    Ovarian cyst    Polyp of sigmoid colon    PONV (postoperative nausea and vomiting)    with Laparoscopy    Postoperative hypothyroidism    Psoriatic arthritis (HCC)    Sleep apnea    no CPAP-lost weight and does not have to use   Thyroid  disease     Past Surgical History:  Procedure Laterality Date   ABDOMINAL HYSTERECTOMY     COLONOSCOPY WITH PROPOFOL  N/A 06/26/2021   Procedure: COLONOSCOPY WITH BIOPSY;  Surgeon: Jinny Carmine, MD;  Location: [facility];  Service: Endoscopy;  Laterality: N/A;   CYSTOSCOPY N/A 02/22/2024   Procedure: CYSTOSCOPY;  Surgeon: Leonce Garnette BIRCH, MD;  Location: [facility];  Service: Gynecology;  Laterality: N/A;   HYSTERECTOMY, TOTAL, LAPAROSCOPIC, ROBOT-ASSISTED WITH SALPINGECTOMY Bilateral 02/22/2024   Procedure: HYSTERECTOMY, TOTAL, LAPAROSCOPIC, ROBOT-ASSISTED WITH SALPINGECTOMY;  Surgeon: Leonce Garnette BIRCH, MD;  Location: [facility];  Service: Gynecology;  Laterality: Bilateral;   LAPAROSCOPY  1997   NASAL SINUS SURGERY  2018   POLYPECTOMY N/A 06/26/2021   Procedure: POLYPECTOMY;  Surgeon: Jinny Carmine, MD;  Location: [facility];  Service: Endoscopy;  Laterality: N/A;   RHINOPLASTY  2018   ROBOTIC ASSISTED LAPAROSCOPIC OVARIAN CYSTECTOMY Left 02/22/2024   Procedure: EXCISION, CYST, OVARY, ROBOT-ASSISTED, LAPAROSCOPIC;  Surgeon: Leonce Garnette BIRCH, MD;  Location: [facility];  Service: Gynecology;  Laterality: Left;   THYROIDECTOMY  2012   WISDOM TOOTH EXTRACTION Bilateral 1996    Family History  Problem Relation Age of Onset  Uterine cancer Mother    Pancreatic cancer Father    Celiac disease Son    Stomach cancer Maternal Aunt    Pancreatic cancer Maternal Uncle    Uterine cancer Maternal Grandmother    Colon cancer Paternal Grandfather     Social History:  reports that she quit smoking about 3 years ago. Her smoking use included cigarettes. She started smoking about 11 years ago. She has a 4 pack-year smoking history. She has never used smokeless tobacco. She reports current alcohol use of about 2.0 standard drinks of alcohol per week.  She reports that she does not use drugs.  Allergies: Allergies[1]  Medications reviewed.    ROS Full ROS performed and is otherwise negative other than what is stated in HPI   BP 110/77   Pulse 61   Temp 98 F (36.7 C) (Oral)   Ht 5' 4 (1.626 m)   Wt 188 lb 3.2 oz (85.4 kg)   LMP 02/22/2024   SpO2 98%   BMI 32.30 kg/m   Physical Exam  CONSTITUTIONAL: NAD. EYES: Pupils are equal, round, Sclera are non-icteric. EARS, NOSE, MOUTH AND THROAT: The oropharynx is clear. The oral mucosa is pink and moist. Hearing is intact to voice. LYMPH NODES:  Lymph nodes in the neck are normal. RESPIRATORY:  Lungs are clear. There is normal respiratory effort, with equal breath sounds bilaterally, and without pathologic use of accessory muscles. CARDIOVASCULAR: Heart is regular without murmurs, gallops, or rubs. GI: The abdomen is  soft, mild tenderness to palpation in the RUQ area w/o Murphy signs or peritonitis. there are no palpable masses. There is no hepatosplenomegaly. There are normal bowel sounds RoBotic incisions are healed well without infection  GU: Rectal deferred.   MUSCULOSKELETAL: Normal muscle strength and tone. No cyanosis or edema.   SKIN: Turgor is good and there are no pathologic skin lesions or ulcers. NEUROLOGIC: Motor and sensation is grossly normal. Cranial nerves are grossly intact. PSYCH:  Oriented to person, place and time. Affect is normal.    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 I had a good discussion regarding her disease process. So far the only intra-abdominal pathology we have found is adenomyomatosis of the gallbladder.  Discussed with her that sometimes this is an incidental findings but in her case I am suspect that these will explain her symptomatology. I have given her options to do an ultrasound and do a close follow-up versus waiting for further gastrointestinal  workup by GI and a third option of proceed  with cholecystectomy. I Was very candid with her, gastrointestinal symptoms tend to be complex and sometimes not very specific to a particular organ. After a good discussion with her she wishes to proceed with cholecystectomy.  The risks, benefits, complications, treatment options, and expected outcomes were discussed with the patient. The possibilities of bleeding, recurrent infection, finding a normal gallbladder, perforation of viscus organs, damage to surrounding structures, bile leak, abscess formation, needing a drain placed, the need for additional procedures, reaction to medication, pulmonary aspiration,  failure to diagnose a condition, the possible need to convert to an open procedure, and creating a complication requiring transfusion or operation were discussed with the patient. The patient and/or family concurred with the proposed plan, giving informed consent.   I personally spent a total of 40 minutes in the care of the patient today including performing a medically appropriate exam/evaluation, counseling and educating, placing orders, referring and  communicating with other health care professionals, documenting clinical information in the EHR, independently interpreting and reviewing images studies and coordinating care.   Laneta Luna, MD FACS General Surgeon     [1]  Allergies Allergen Reactions   Aspartame Other (See Comments)    Headaches   Tree Extract Anaphylaxis    Tree nuts

## 2024-05-21 NOTE — Telephone Encounter (Signed)
 Patient has been advised of Pre-Admission date/time, and Surgery date at [facility].  Surgery Date: 06/14/24 Preadmission Testing Date: Preadmissions to call patient.    Patient informed of the scheduling process and surgery information given at time of office visit.   Patient has been made aware to call [phone], between 1-3:00pm the day before surgery, to find out what time to arrive for surgery.

## 2024-05-21 NOTE — Patient Instructions (Signed)
 You have requested to have your gallbladder removed. This will be done at [facility] with Dr. Jordis.   If you are on any injectable weight loss medication, you will need to stop taking your GLP-1 injectable (weight loss) medications 8 days before your surgery to avoid any complications with anesthesia.    You will most likely be out of work 1-2 weeks for this surgery.  If you have FMLA or disability paperwork that needs filled out you may drop this off at our office or this can be faxed to [phone].   You will return after your post-op appointment with a lifting restriction for approximately 4 more weeks.   You will be able to eat anything you would like to following surgery. But, start by eating a bland diet and advance this as tolerated. The Gallbladder diet is below, please go as closely by this diet as possible prior to surgery to avoid any further attacks.   Please see the (blue)pre-care form that you have been given today. You will receive several phone calls from [facility] prior to your surgery date.   If you have any questions, please call our office.   Laparoscopic Cholecystectomy Laparoscopic cholecystectomy is surgery to remove the gallbladder. The gallbladder is located in the upper right part of the abdomen, behind the liver. It is a storage sac for bile, which is produced in the liver. Bile aids in the digestion and absorption of fats. Cholecystectomy is often done for inflammation of the gallbladder (cholecystitis). This condition is usually caused by a buildup of gallstones (cholelithiasis) in the gallbladder. Gallstones can block the flow of bile, and that can result in inflammation and pain. In severe cases, emergency surgery may be required. If emergency surgery is not required, you will have time to prepare for the procedure. Laparoscopic surgery is an alternative to open surgery. Laparoscopic surgery has a shorter recovery time. Your common bile duct may also  need to be examined during the procedure. If stones are found in the common bile duct, they may be removed. LET [facility] CARE PROVIDER KNOW ABOUT: Any allergies you have. All medicines you are taking, including vitamins, herbs, eye drops, creams, and over-the-counter medicines. Previous problems you or members of your family have had with the use of anesthetics. Any blood disorders you have. Previous surgeries you have had.  Any medical conditions you have. RISKS AND COMPLICATIONS Generally, this is a safe procedure. However, problems may occur, including: Infection. Bleeding. Allergic reactions to medicines. Damage to other structures or organs. A stone remaining in the common bile duct. A bile leak from the cyst duct that is clipped when your gallbladder is removed. The need to convert to open surgery, which requires a larger incision in the abdomen. This may be necessary if your surgeon thinks that it is not safe to continue with a laparoscopic procedure. BEFORE THE PROCEDURE Ask your health care provider about: Changing or stopping your regular medicines. This is especially important if you are taking diabetes medicines or blood thinners. Taking medicines such as aspirin  and ibuprofen. These medicines can thin your blood. Do not take these medicines before your procedure if your health care provider instructs you not to. Follow instructions from your health care provider about eating or drinking restrictions. Let your health care provider know if you develop a cold or an infection before surgery. Plan to have someone take you home after the procedure. Ask your health care provider how your surgical site will be  marked or identified. You may be given antibiotic medicine to help prevent infection. PROCEDURE To reduce your risk of infection: Your health care team will wash or sanitize their hands. Your skin will be washed with soap. An IV tube may be inserted into one of your  veins. You will be given a medicine to make you fall asleep (general anesthetic). A breathing tube will be placed in your mouth. The surgeon will make several small cuts (incisions) in your abdomen. A thin, lighted tube (laparoscope) that has a tiny camera on the end will be inserted through one of the small incisions. The camera on the laparoscope will send a picture to a TV screen (monitor) in the operating room. This will give the surgeon a good view inside your abdomen. A gas will be pumped into your abdomen. This will expand your abdomen to give the surgeon more room to perform the surgery. Other tools that are needed for the procedure will be inserted through the other incisions. The gallbladder will be removed through one of the incisions. After your gallbladder has been removed, the incisions will be closed with stitches (sutures), staples, or skin glue. Your incisions may be covered with a bandage (dressing). The procedure may vary among health care providers and hospitals. AFTER THE PROCEDURE Your blood pressure, heart rate, breathing rate, and blood oxygen level will be monitored often until the medicines you were given have worn off. You will be given medicines as needed to control your pain.   This information is not intended to replace advice given to you by your health care provider. Make sure you discuss any questions you have with your health care provider.   Document Released: 04/26/2005 Document Revised: 01/15/2015 Document Reviewed: 12/06/2012 Elsevier Interactive Patient Education 2016 Else[employer] Inc.     Low-Fat Diet for Gallbladder Conditions A low-fat diet can be helpful if you have pancreatitis or a gallbladder condition. With these conditions, your pancreas and gallbladder have trouble digesting fats. A healthy eating plan with less fat will help rest your pancreas and gallbladder and reduce your symptoms. WHAT DO I NEED TO KNOW ABOUT THIS DIET? Eat a low-fat  diet. Reduce your fat intake to less than 20-30% of your total daily calories. This is less than 50-60 g of fat per day. Remember that you need some fat in your diet. Ask your dietician what your daily goal should be. Choose nonfat and low-fat healthy foods. Look for the words nonfat, low fat, or fat free. As a guide, look on the label and choose foods with less than 3 g of fat per serving. Eat only one serving. Avoid alcohol. Do not smoke. If you need help quitting, talk with your health care provider. Eat small frequent meals instead of three large heavy meals. WHAT FOODS CAN I EAT? Grains Include healthy grains and starches such as potatoes, wheat bread, fiber-rich cereal, and brown rice. Choose whole grain options whenever possible. In adults, whole grains should account for 45-65% of your daily calories.  Fruits and Vegetables Eat plenty of fruits and vegetables. Fresh fruits and vegetables add fiber to your diet. Meats and Other Protein Sources Eat lean meat such as chicken and pork. Trim any fat off of meat before cooking it. Eggs, fish, and beans are other sources of protein. In adults, these foods should account for 10-35% of your daily calories. Dairy Choose low-fat milk and dairy options. Dairy includes fat and protein, as well as calcium .  Fats and Oils Limit high-fat  foods such as fried foods, sweets, baked goods, sugary drinks.  Other Creamy sauces and condiments, such as mayonnaise, can add extra fat. Think about whether or not you need to use them, or use smaller amounts or low fat options. WHAT FOODS ARE NOT RECOMMENDED? High fat foods, such as: [employer]. Ice cream. French toast. Sweet rolls. Pizza. Cheese bread. Foods covered with batter, butter, creamy sauces, or cheese. Fried foods. Sugary drinks and desserts. Foods that cause gas or bloating   This information is not intended to replace advice given to you by your health care provider. Make sure you  discuss any questions you have with your health care provider.   Document Released: 05/01/2013 Document Reviewed: 05/01/2013 Elsevier Interactive Patient Education [employer]

## 2024-05-21 NOTE — Telephone Encounter (Signed)
 Pt is asking when she has her surgery could she discuss with the provider about her bladder issue. She wants to know if the bladder can be tacked up while having this surgery since already under.Pt # [phone]

## 2024-05-22 NOTE — Telephone Encounter (Signed)
 US  scheduled for 05/23/24 9:15 am @ [facility]. NPO after midnight. Pt notified. Verbalizes understanding.

## 2024-05-23 ENCOUNTER — Ambulatory Visit
Admission: RE | Admit: 2024-05-23 | Discharge: 2024-05-23 | Disposition: A | Discharge status: Home / Self Care | Source: Ambulatory Visit | Attending: Surgery | Admitting: Surgery

## 2024-05-23 DIAGNOSIS — K8010 Calculus of gallbladder with chronic cholecystitis without obstruction: Secondary | ICD-10-CM | POA: Insufficient documentation

## 2024-05-30 ENCOUNTER — Encounter: Payer: Self-pay | Admitting: Surgery

## 2024-05-30 ENCOUNTER — Ambulatory Visit: Admitting: Surgery

## 2024-05-30 VITALS — BP 118/76 | HR 71 | Ht 64.0 in | Wt 188.0 lb

## 2024-05-30 DIAGNOSIS — K8050 Calculus of bile duct without cholangitis or cholecystitis without obstruction: Secondary | ICD-10-CM

## 2024-05-30 DIAGNOSIS — K8020 Calculus of gallbladder without cholecystitis without obstruction: Secondary | ICD-10-CM

## 2024-05-30 NOTE — Patient Instructions (Signed)
 You have requested to have your gallbladder removed. This will be done at [facility] with Dr. Jordis.   If you are on any injectable weight loss medication, you will need to stop taking your GLP-1 injectable (weight loss) medications 8 days before your surgery to avoid any complications with anesthesia.    You will most likely be out of work 1-2 weeks for this surgery.  If you have FMLA or disability paperwork that needs filled out you may drop this off at our office or this can be faxed to [phone].   You will return after your post-op appointment with a lifting restriction for approximately 4 more weeks.   You will be able to eat anything you would like to following surgery. But, start by eating a bland diet and advance this as tolerated. The Gallbladder diet is below, please go as closely by this diet as possible prior to surgery to avoid any further attacks.   Please see the (blue)pre-care form that you have been given today. You will receive several phone calls from [facility] prior to your surgery date.   If you have any questions, please call our office.   Laparoscopic Cholecystectomy Laparoscopic cholecystectomy is surgery to remove the gallbladder. The gallbladder is located in the upper right part of the abdomen, behind the liver. It is a storage sac for bile, which is produced in the liver. Bile aids in the digestion and absorption of fats. Cholecystectomy is often done for inflammation of the gallbladder (cholecystitis). This condition is usually caused by a buildup of gallstones (cholelithiasis) in the gallbladder. Gallstones can block the flow of bile, and that can result in inflammation and pain. In severe cases, emergency surgery may be required. If emergency surgery is not required, you will have time to prepare for the procedure. Laparoscopic surgery is an alternative to open surgery. Laparoscopic surgery has a shorter recovery time. Your common bile duct may also  need to be examined during the procedure. If stones are found in the common bile duct, they may be removed. LET [facility] CARE PROVIDER KNOW ABOUT: Any allergies you have. All medicines you are taking, including vitamins, herbs, eye drops, creams, and over-the-counter medicines. Previous problems you or members of your family have had with the use of anesthetics. Any blood disorders you have. Previous surgeries you have had.  Any medical conditions you have. RISKS AND COMPLICATIONS Generally, this is a safe procedure. However, problems may occur, including: Infection. Bleeding. Allergic reactions to medicines. Damage to other structures or organs. A stone remaining in the common bile duct. A bile leak from the cyst duct that is clipped when your gallbladder is removed. The need to convert to open surgery, which requires a larger incision in the abdomen. This may be necessary if your surgeon thinks that it is not safe to continue with a laparoscopic procedure. BEFORE THE PROCEDURE Ask your health care provider about: Changing or stopping your regular medicines. This is especially important if you are taking diabetes medicines or blood thinners. Taking medicines such as aspirin  and ibuprofen. These medicines can thin your blood. Do not take these medicines before your procedure if your health care provider instructs you not to. Follow instructions from your health care provider about eating or drinking restrictions. Let your health care provider know if you develop a cold or an infection before surgery. Plan to have someone take you home after the procedure. Ask your health care provider how your surgical site will be  marked or identified. You may be given antibiotic medicine to help prevent infection. PROCEDURE To reduce your risk of infection: Your health care team will wash or sanitize their hands. Your skin will be washed with soap. An IV tube may be inserted into one of your  veins. You will be given a medicine to make you fall asleep (general anesthetic). A breathing tube will be placed in your mouth. The surgeon will make several small cuts (incisions) in your abdomen. A thin, lighted tube (laparoscope) that has a tiny camera on the end will be inserted through one of the small incisions. The camera on the laparoscope will send a picture to a TV screen (monitor) in the operating room. This will give the surgeon a good view inside your abdomen. A gas will be pumped into your abdomen. This will expand your abdomen to give the surgeon more room to perform the surgery. Other tools that are needed for the procedure will be inserted through the other incisions. The gallbladder will be removed through one of the incisions. After your gallbladder has been removed, the incisions will be closed with stitches (sutures), staples, or skin glue. Your incisions may be covered with a bandage (dressing). The procedure may vary among health care providers and hospitals. AFTER THE PROCEDURE Your blood pressure, heart rate, breathing rate, and blood oxygen level will be monitored often until the medicines you were given have worn off. You will be given medicines as needed to control your pain.   This information is not intended to replace advice given to you by your health care provider. Make sure you discuss any questions you have with your health care provider.   Document Released: 04/26/2005 Document Revised: 01/15/2015 Document Reviewed: 12/06/2012 Elsevier Interactive Patient Education 2016 Else[employer] Inc.     Low-Fat Diet for Gallbladder Conditions A low-fat diet can be helpful if you have pancreatitis or a gallbladder condition. With these conditions, your pancreas and gallbladder have trouble digesting fats. A healthy eating plan with less fat will help rest your pancreas and gallbladder and reduce your symptoms. WHAT DO I NEED TO KNOW ABOUT THIS DIET? Eat a low-fat  diet. Reduce your fat intake to less than 20-30% of your total daily calories. This is less than 50-60 g of fat per day. Remember that you need some fat in your diet. Ask your dietician what your daily goal should be. Choose nonfat and low-fat healthy foods. Look for the words nonfat, low fat, or fat free. As a guide, look on the label and choose foods with less than 3 g of fat per serving. Eat only one serving. Avoid alcohol. Do not smoke. If you need help quitting, talk with your health care provider. Eat small frequent meals instead of three large heavy meals. WHAT FOODS CAN I EAT? Grains Include healthy grains and starches such as potatoes, wheat bread, fiber-rich cereal, and brown rice. Choose whole grain options whenever possible. In adults, whole grains should account for 45-65% of your daily calories.  Fruits and Vegetables Eat plenty of fruits and vegetables. Fresh fruits and vegetables add fiber to your diet. Meats and Other Protein Sources Eat lean meat such as chicken and pork. Trim any fat off of meat before cooking it. Eggs, fish, and beans are other sources of protein. In adults, these foods should account for 10-35% of your daily calories. Dairy Choose low-fat milk and dairy options. Dairy includes fat and protein, as well as calcium .  Fats and Oils Limit high-fat  foods such as fried foods, sweets, baked goods, sugary drinks.  Other Creamy sauces and condiments, such as mayonnaise, can add extra fat. Think about whether or not you need to use them, or use smaller amounts or low fat options. WHAT FOODS ARE NOT RECOMMENDED? High fat foods, such as: [employer]. Ice cream. French toast. Sweet rolls. Pizza. Cheese bread. Foods covered with batter, butter, creamy sauces, or cheese. Fried foods. Sugary drinks and desserts. Foods that cause gas or bloating   This information is not intended to replace advice given to you by your health care provider. Make sure you  discuss any questions you have with your health care provider.   Document Released: 05/01/2013 Document Reviewed: 05/01/2013 Elsevier Interactive Patient Education [employer]

## 2024-06-01 NOTE — Progress Notes (Addendum)
 Outpatient Surgical Follow Up  06/01/2024  Alison Logan is an 49 y.o. female.   Chief Complaint  Patient presents with   Follow-up    HPI:  Alison Logan is a 49 y.o. female seen in f/u for abdominal pain. she had hysterectomy robotically 3 1/2 months ago or so.  She says that 3 days after the surgery she develops acute onset of low pelvic pain as well as rectal pain.  The pain was moderate to severe intermittent without any specific aggravating or rating factors.  She did have quite extensive workup including a CT scan of the abdomen pelvis that I personally reviewed showing no evidence of bowel injuries.  There was evidence of some inflammatory changes in the pelvis consistent with recent surgery.  There is evidence of potential changes consistent with diverticulosis and diverticulitis in the sigmoid colon.  There is no abscess there is no perforation. I repeated a CT which I have pers reviewed, showing calcification of GB c/w adenomyomatosis, no other acute findings. U/S ordered and pers reviewed showing GS. She continues to endorse some intermittent pain.  No fevers no chills she is tolerating diet and having bowel movements.  She states that the pain is sharp intermittent and radiates to the back in a band like fashion. She does have some stress incontinence that seems to be exacerbated over the last few months   Past Medical History:  Diagnosis Date   Allergy    Anxiety    Asthma    childhood   Chronic low blood pressure    Complication of anesthesia    Fibroid uterus    Former smoker    History of gastroesophageal reflux (GERD)    Menorrhagia with irregular cycle    Migraines    Obesity    Ovarian cyst    Polyp of sigmoid colon    PONV (postoperative nausea and vomiting)    with Laparoscopy   Postoperative hypothyroidism    Psoriatic arthritis (HCC)    Sleep apnea    no CPAP-lost weight and does not have to use   Thyroid  disease     Past Surgical History:   Procedure Laterality Date   ABDOMINAL HYSTERECTOMY     COLONOSCOPY WITH PROPOFOL  N/A 06/26/2021   Procedure: COLONOSCOPY WITH BIOPSY;  Surgeon: Jinny Carmine, MD;  Location: [facility];  Service: Endoscopy;  Laterality: N/A;   CYSTOSCOPY N/A 02/22/2024   Procedure: CYSTOSCOPY;  Surgeon: Leonce Garnette BIRCH, MD;  Location: [facility];  Service: Gynecology;  Laterality: N/A;   HYSTERECTOMY, TOTAL, LAPAROSCOPIC, ROBOT-ASSISTED WITH SALPINGECTOMY Bilateral 02/22/2024   Procedure: HYSTERECTOMY, TOTAL, LAPAROSCOPIC, ROBOT-ASSISTED WITH SALPINGECTOMY;  Surgeon: Leonce Garnette BIRCH, MD;  Location: [facility];  Service: Gynecology;  Laterality: Bilateral;   LAPAROSCOPY  1997   NASAL SINUS SURGERY  2018   POLYPECTOMY N/A 06/26/2021   Procedure: POLYPECTOMY;  Surgeon: Jinny Carmine, MD;  Location: [facility];  Service: Endoscopy;  Laterality: N/A;   RHINOPLASTY  2018   ROBOTIC ASSISTED LAPAROSCOPIC OVARIAN CYSTECTOMY Left 02/22/2024   Procedure: EXCISION, CYST, OVARY, ROBOT-ASSISTED, LAPAROSCOPIC;  Surgeon: Leonce Garnette BIRCH, MD;  Location: [facility];  Service: Gynecology;  Laterality: Left;   THYROIDECTOMY  2012   WISDOM TOOTH EXTRACTION Bilateral 1996    Family History  Problem Relation Age of Onset   Uterine cancer Mother    Pancreatic cancer Father    Celiac disease Son    Stomach cancer Maternal Aunt    Pancreatic cancer Maternal Uncle  Uterine cancer Maternal Grandmother    Colon cancer Paternal Grandfather     Social History:  reports that she quit smoking about 3 years ago. Her smoking use included cigarettes. She started smoking about 11 years ago. She has a 4 pack-year smoking history. She has never used smokeless tobacco. She reports current alcohol use of about 2.0 standard drinks of alcohol per week. She reports that she does not use drugs.  Allergies: Allergies[1]  Medications reviewed.    ROS Full ROS performed and is otherwise negative other than what is  stated in HPI   BP 118/76   Pulse 71   Ht 5' 4 (1.626 m)   Wt 188 lb (85.3 kg)   LMP 02/22/2024   SpO2 98%   BMI 32.27 kg/m   Physical Exam  CONSTITUTIONAL: NAD. EYES: Pupils are equal, round, Sclera are non-icteric. EARS, NOSE, MOUTH AND THROAT: The oropharynx is clear. The oral mucosa is pink and moist. Hearing is intact to voice. LYMPH NODES:  Lymph nodes in the neck are normal. RESPIRATORY:  Lungs are clear. There is normal respiratory effort, with equal breath sounds bilaterally, and without pathologic use of accessory muscles. CARDIOVASCULAR: Heart is regular without murmurs, gallops, or rubs. GI: The abdomen is  soft, mild tenderness to palpation in the RUQ area w/o Murphy signs or peritonitis. there are no palpable masses. There is no hepatosplenomegaly. There are normal bowel sounds RoBotic incisions are healed well without infection  GU: Rectal deferred.   MUSCULOSKELETAL: Normal muscle strength and tone. No cyanosis or edema.   SKIN: Turgor is good and there are no pathologic skin lesions or ulcers. NEUROLOGIC: Motor and sensation is grossly normal. Cranial nerves are grossly intact. PSYCH:  Oriented to person, place and time. Affect is normal.    Assessment/Plan: 49 year old female with intermittent abdominal pain likely related to biliary colic.  Discussed with patient in detail about her disease process.  Given clinical findings and imaging studies I do recommend cholecystectomy, she is in agreement The risks, benefits, complications, treatment options, and expected outcomes were discussed with the patient. The possibilities of bleeding, recurrent infection, finding a normal gallbladder, perforation of viscus organs, damage to surrounding structures, bile leak, abscess formation, needing a drain placed, the need for additional procedures, reaction to medication, pulmonary aspiration,  failure to diagnose a condition, the possible need to convert to an open procedure,  and creating a complication requiring transfusion or operation were discussed with the patient. The patient and/or family concurred with the proposed plan, giving informed consent.  I personally spent a total of 30 minutes in the care of the patient today including performing a medically appropriate exam/evaluation, counseling and educating, placing orders, referring and communicating with other health care professionals, documenting clinical information in the EHR, independently interpreting and reviewing images studies and coordinating care.   Laneta Luna, MD FACS General Surgeon     [1]  Allergies Allergen Reactions   Aspartame Other (See Comments)    Headaches   Tree Extract Anaphylaxis    Tree nuts

## 2024-06-01 NOTE — H&P (View-Only) (Signed)
 Outpatient Surgical Follow Up  06/01/2024  Alison Logan is an 49 y.o. female.   Chief Complaint  Patient presents with   Follow-up    HPI:  Alison Logan is a 49 y.o. female seen in f/u for abdominal pain. she had hysterectomy robotically 3 1/2 months ago or so.  She says that 3 days after the surgery she develops acute onset of low pelvic pain as well as rectal pain.  The pain was moderate to severe intermittent without any specific aggravating or rating factors.  She did have quite extensive workup including a CT scan of the abdomen pelvis that I personally reviewed showing no evidence of bowel injuries.  There was evidence of some inflammatory changes in the pelvis consistent with recent surgery.  There is evidence of potential changes consistent with diverticulosis and diverticulitis in the sigmoid colon.  There is no abscess there is no perforation. I repeated a CT which I have pers reviewed, showing calcification of GB c/w adenomyomatosis, no other acute findings. U/S ordered and pers reviewed showing GS. She continues to endorse some intermittent pain.  No fevers no chills she is tolerating diet and having bowel movements.  She states that the pain is sharp intermittent and radiates to the back in a band like fashion. She does have some stress incontinence that seems to be exacerbated over the last few months   Past Medical History:  Diagnosis Date   Allergy    Anxiety    Asthma    childhood   Chronic low blood pressure    Complication of anesthesia    Fibroid uterus    Former smoker    History of gastroesophageal reflux (GERD)    Menorrhagia with irregular cycle    Migraines    Obesity    Ovarian cyst    Polyp of sigmoid colon    PONV (postoperative nausea and vomiting)    with Laparoscopy   Postoperative hypothyroidism    Psoriatic arthritis (HCC)    Sleep apnea    no CPAP-lost weight and does not have to use   Thyroid  disease     Past Surgical History:   Procedure Laterality Date   ABDOMINAL HYSTERECTOMY     COLONOSCOPY WITH PROPOFOL  N/A 06/26/2021   Procedure: COLONOSCOPY WITH BIOPSY;  Surgeon: Jinny Carmine, MD;  Location: [facility];  Service: Endoscopy;  Laterality: N/A;   CYSTOSCOPY N/A 02/22/2024   Procedure: CYSTOSCOPY;  Surgeon: Leonce Garnette BIRCH, MD;  Location: [facility];  Service: Gynecology;  Laterality: N/A;   HYSTERECTOMY, TOTAL, LAPAROSCOPIC, ROBOT-ASSISTED WITH SALPINGECTOMY Bilateral 02/22/2024   Procedure: HYSTERECTOMY, TOTAL, LAPAROSCOPIC, ROBOT-ASSISTED WITH SALPINGECTOMY;  Surgeon: Leonce Garnette BIRCH, MD;  Location: [facility];  Service: Gynecology;  Laterality: Bilateral;   LAPAROSCOPY  1997   NASAL SINUS SURGERY  2018   POLYPECTOMY N/A 06/26/2021   Procedure: POLYPECTOMY;  Surgeon: Jinny Carmine, MD;  Location: [facility];  Service: Endoscopy;  Laterality: N/A;   RHINOPLASTY  2018   ROBOTIC ASSISTED LAPAROSCOPIC OVARIAN CYSTECTOMY Left 02/22/2024   Procedure: EXCISION, CYST, OVARY, ROBOT-ASSISTED, LAPAROSCOPIC;  Surgeon: Leonce Garnette BIRCH, MD;  Location: [facility];  Service: Gynecology;  Laterality: Left;   THYROIDECTOMY  2012   WISDOM TOOTH EXTRACTION Bilateral 1996    Family History  Problem Relation Age of Onset   Uterine cancer Mother    Pancreatic cancer Father    Celiac disease Son    Stomach cancer Maternal Aunt    Pancreatic cancer Maternal Uncle  Uterine cancer Maternal Grandmother    Colon cancer Paternal Grandfather     Social History:  reports that she quit smoking about 3 years ago. Her smoking use included cigarettes. She started smoking about 11 years ago. She has a 4 pack-year smoking history. She has never used smokeless tobacco. She reports current alcohol use of about 2.0 standard drinks of alcohol per week. She reports that she does not use drugs.  Allergies: Allergies[1]  Medications reviewed.    ROS Full ROS performed and is otherwise negative other than what is  stated in HPI   BP 118/76   Pulse 71   Ht 5' 4 (1.626 m)   Wt 188 lb (85.3 kg)   LMP 02/22/2024   SpO2 98%   BMI 32.27 kg/m   Physical Exam  CONSTITUTIONAL: NAD. EYES: Pupils are equal, round, Sclera are non-icteric. EARS, NOSE, MOUTH AND THROAT: The oropharynx is clear. The oral mucosa is pink and moist. Hearing is intact to voice. LYMPH NODES:  Lymph nodes in the neck are normal. RESPIRATORY:  Lungs are clear. There is normal respiratory effort, with equal breath sounds bilaterally, and without pathologic use of accessory muscles. CARDIOVASCULAR: Heart is regular without murmurs, gallops, or rubs. GI: The abdomen is  soft, mild tenderness to palpation in the RUQ area w/o Murphy signs or peritonitis. there are no palpable masses. There is no hepatosplenomegaly. There are normal bowel sounds RoBotic incisions are healed well without infection  GU: Rectal deferred.   MUSCULOSKELETAL: Normal muscle strength and tone. No cyanosis or edema.   SKIN: Turgor is good and there are no pathologic skin lesions or ulcers. NEUROLOGIC: Motor and sensation is grossly normal. Cranial nerves are grossly intact. PSYCH:  Oriented to person, place and time. Affect is normal.    Assessment/Plan: 49 year old female with intermittent abdominal pain likely related to biliary colic.  Discussed with patient in detail about her disease process.  Given clinical findings and imaging studies I do recommend cholecystectomy, she is in agreement The risks, benefits, complications, treatment options, and expected outcomes were discussed with the patient. The possibilities of bleeding, recurrent infection, finding a normal gallbladder, perforation of viscus organs, damage to surrounding structures, bile leak, abscess formation, needing a drain placed, the need for additional procedures, reaction to medication, pulmonary aspiration,  failure to diagnose a condition, the possible need to convert to an open procedure,  and creating a complication requiring transfusion or operation were discussed with the patient. The patient and/or family concurred with the proposed plan, giving informed consent.  I personally spent a total of 30 minutes in the care of the patient today including performing a medically appropriate exam/evaluation, counseling and educating, placing orders, referring and communicating with other health care professionals, documenting clinical information in the EHR, independently interpreting and reviewing images studies and coordinating care.   Laneta Luna, MD FACS General Surgeon     [1]  Allergies Allergen Reactions   Aspartame Other (See Comments)    Headaches   Tree Extract Anaphylaxis    Tree nuts

## 2024-06-04 ENCOUNTER — Ambulatory Visit: Admitting: Surgery

## 2024-06-07 ENCOUNTER — Other Ambulatory Visit: Payer: Self-pay

## 2024-06-07 ENCOUNTER — Encounter
Admission: RE | Admit: 2024-06-07 | Discharge: 2024-06-07 | Disposition: A | Discharge status: Home / Self Care | Source: Ambulatory Visit | Attending: Surgery | Admitting: Surgery

## 2024-06-07 HISTORY — DX: Calculus of bile duct without cholangitis or cholecystitis without obstruction: K80.50

## 2024-06-07 HISTORY — DX: Other cervical disc degeneration, unspecified cervical region: M50.30

## 2024-06-07 HISTORY — DX: Personal history of other diseases of the digestive system: Z87.19

## 2024-06-07 NOTE — Patient Instructions (Signed)
 Your procedure is scheduled on:06-14-24 Thursday Report to the Registration Desk on the 1st floor of the [facility].Then proceed to the 2nd floor Surgery Desk To find out your arrival time, please call [phone] between 1PM - 3PM on:06-13-24 Wednesday If your arrival time is 6:00 am, do not arrive before that time as the [facility] entrance doors do not open until 6:00 am.  REMEMBER: Instructions that are not followed completely may result in serious medical risk, up to and including death; or upon the discretion of your surgeon and anesthesiologist your surgery may need to be rescheduled.  Do not eat food OR drink liquids after midnight the night before surgery.  No gum chewing or hard candies.  One week prior to surgery:Stop NOW (06-07-24)  Stop Anti-inflammatories (NSAIDS) such as Advil , Aleve, Ibuprofen , Motrin , Naproxen, Naprosyn and Aspirin based products such as Excedrin, Goody's Powder, BC Powder. Stop ANY OVER THE COUNTER supplements until after surgery (Probiotic, Vitamin C, Nitric Oxide)  You may however, continue to take Tylenol  if needed for pain up until the day of surgery.  Continue taking all of your other prescription medications up until the day of surgery.  ON THE DAY OF SURGERY ONLY TAKE THESE MEDICATIONS WITH SIPS OF WATER : -levothyroxine (SYNTHROID)  -liothyronine (CYTOMEL)   No Alcohol for 24 hours before or after surgery.  No Smoking including e-cigarettes for 24 hours before surgery.  No chewable tobacco products for at least 6 hours before surgery.  No nicotine patches on the day of surgery.  Do not use any recreational drugs for at least a week (preferably 2 weeks) before your surgery.  Please be advised that the combination of cocaine and anesthesia may have negative outcomes, up to and including death. If you test positive for cocaine, your surgery will be cancelled.  On the morning of surgery brush your teeth with toothpaste and water , you may  rinse your mouth with mouthwash if you wish. Do not swallow any toothpaste or mouthwash.  Use CHG Soap as directed on instruction sheet.  Do not wear jewelry, make-up, hairpins, clips or nail polish.  For welded (permanent) jewelry: bracelets, anklets, waist bands, etc.  Please have this removed prior to surgery.  If it is not removed, there is a chance that hospital personnel will need to cut it off on the day of surgery.  Do not wear lotions, powders, or perfumes.   Do not shave body hair from the neck down 48 hours before surgery.  Contact lenses, hearing aids and dentures may not be worn into surgery.  Do not bring valuables to the hospital. [facility] is not responsible for any missing/lost belongings or valuables.   Notify your doctor if there is any change in your medical condition (cold, fever, infection).  Wear comfortable clothing (specific to your surgery type) to the hospital.  After surgery, you can help prevent lung complications by doing breathing exercises.  Take deep breaths and cough every 1-2 hours. Your doctor may order a device called an Incentive Spirometer to help you take deep breaths. When coughing or sneezing, hold a pillow firmly against your incision with both hands. This is called splinting. Doing this helps protect your incision. It also decreases belly discomfort.  If you are being admitted to the hospital overnight, leave your suitcase in the car. After surgery it may be brought to your room.  In case of increased patient census, it may be necessary for you, the patient, to continue your postoperative care  in the Same Day Surgery department.  If you are being discharged the day of surgery, you will not be allowed to drive home. You will need a responsible individual to drive you home and stay with you for 24 hours after surgery.   If you are taking public transportation, you will need to have a responsible individual with you.  Please call the  Pre-admissions Testing Dept. at [phone] if you have any questions about these instructions.  Surgery Visitation Policy:  Patients having surgery or a procedure may have two visitors.  Children under the age of 42 must have an adult with them who is not the patient.                                                                                                             Preparing for Surgery with CHLORHEXIDINE  GLUCONATE (CHG) Soap  Chlorhexidine  Gluconate (CHG) Soap  o An antiseptic cleaner that kills germs and bonds with the skin to continue killing germs even after washing  o Used for showering the night before surgery and morning of surgery  Before surgery, you can play an important role by reducing the number of germs on your skin.  CHG (Chlorhexidine  gluconate) soap is an antiseptic cleanser which kills germs and bonds with the skin to continue killing germs even after washing.  Please do not use if you have an allergy to CHG or antibacterial soaps. If your skin becomes reddened/irritated stop using the CHG.  1. Shower the NIGHT BEFORE SURGERY with CHG soap.  2. If you choose to wash your hair, wash your hair first as usual with your normal shampoo.  3. After shampooing, rinse your hair and body thoroughly to remove the shampoo.  4. Use CHG as you would any other liquid soap. You can apply CHG directly to the skin and wash gently with a clean washcloth.  5. Apply the CHG soap to your body only from the neck down. Do not use on open wounds or open sores. Avoid contact with your eyes, ears, mouth, and genitals (private parts). Wash face and genitals (private parts) with your normal soap.  6. Wash thoroughly, paying special attention to the area where your surgery will be performed.  7. Thoroughly rinse your body with warm water .  8. Do not shower/wash with your normal soap after using and rinsing off the CHG soap.  9. Do not use lotions, oils, etc., after showering  with CHG.  10. Pat yourself dry with a clean towel.  11. Wear clean pajamas to bed the night before surgery.  12. Place clean sheets on your bed the night of your shower and do not sleep with pets.  13. Do not apply any deodorants/lotions/powders.  14. Please wear clean clothes to the hospital.  15. Remember to brush your teeth with your regular toothpaste.  Merchandiser, Retail to address health-related social needs:  [email or web address]

## 2024-06-14 ENCOUNTER — Encounter
Admission: RE | Disposition: A | Discharge status: Home / Self Care | Payer: Self-pay | Source: Home / Self Care | Attending: Surgery

## 2024-06-14 ENCOUNTER — Ambulatory Visit
Admission: RE | Admit: 2024-06-14 | Discharge: 2024-06-14 | Disposition: A | Discharge status: Home / Self Care | Source: Home / Self Care | Attending: Surgery | Admitting: Surgery

## 2024-06-14 ENCOUNTER — Encounter: Payer: Self-pay | Admitting: Surgery

## 2024-06-14 ENCOUNTER — Ambulatory Visit: Payer: Self-pay | Admitting: Urgent Care

## 2024-06-14 ENCOUNTER — Ambulatory Visit

## 2024-06-14 ENCOUNTER — Other Ambulatory Visit: Payer: Self-pay

## 2024-06-14 DIAGNOSIS — K8044 Calculus of bile duct with chronic cholecystitis without obstruction: Secondary | ICD-10-CM | POA: Diagnosis not present

## 2024-06-14 DIAGNOSIS — D135 Benign neoplasm of extrahepatic bile ducts: Secondary | ICD-10-CM

## 2024-06-14 MED ORDER — GABAPENTIN 300 MG PO CAPS
ORAL_CAPSULE | ORAL | Status: AC
  Filled 2024-06-14: qty 1

## 2024-06-14 MED ORDER — ACETAMINOPHEN 10 MG/ML IV SOLN
INTRAVENOUS | Status: AC
  Filled 2024-06-14: qty 100

## 2024-06-14 MED ORDER — INDOCYANINE GREEN 25 MG IJ SOLR
1.2500 mg | Freq: Once | INTRAMUSCULAR | Status: AC
  Administered 2024-06-14: 1.25 mg via INTRAVENOUS

## 2024-06-14 MED ORDER — LACTATED RINGERS IV SOLN: INTRAVENOUS | Status: DC

## 2024-06-14 MED ORDER — ROCURONIUM BROMIDE 10 MG/ML (PF) SYRINGE
PREFILLED_SYRINGE | INTRAVENOUS | Status: AC
  Filled 2024-06-14: qty 10

## 2024-06-14 MED ORDER — MIDAZOLAM HCL (PF) 2 MG/2ML IJ SOLN
INTRAMUSCULAR | Status: DC | PRN
  Administered 2024-06-14: 2 mg via INTRAVENOUS

## 2024-06-14 MED ORDER — OXYCODONE HCL 5 MG PO TABS
ORAL_TABLET | ORAL | Status: AC
  Filled 2024-06-14: qty 1

## 2024-06-14 MED ORDER — DEXMEDETOMIDINE HCL IN NACL 80 MCG/20ML IV SOLN
INTRAVENOUS | Status: DC | PRN
  Administered 2024-06-14: 8 ug via INTRAVENOUS

## 2024-06-14 MED ORDER — ONDANSETRON HCL 4 MG/2ML IJ SOLN
INTRAMUSCULAR | Status: DC | PRN
  Administered 2024-06-14: 4 mg via INTRAVENOUS

## 2024-06-14 MED ORDER — LIDOCAINE HCL (PF) 2 % IJ SOLN
INTRAMUSCULAR | Status: AC
  Filled 2024-06-14: qty 5

## 2024-06-14 MED ORDER — FENTANYL CITRATE (PF) 100 MCG/2ML IJ SOLN
INTRAMUSCULAR | Status: AC
  Filled 2024-06-14: qty 2

## 2024-06-14 MED ORDER — BUPIVACAINE-EPINEPHRINE (PF) 0.25% -1:200000 IJ SOLN
INTRAMUSCULAR | Status: AC
  Filled 2024-06-14: qty 30

## 2024-06-14 MED ORDER — CEFAZOLIN SODIUM-DEXTROSE 2-4 GM/100ML-% IV SOLN
2.0000 g | INTRAVENOUS | Status: AC
  Administered 2024-06-14: 2 g via INTRAVENOUS

## 2024-06-14 MED ORDER — GABAPENTIN 300 MG PO CAPS
300.0000 mg | ORAL_CAPSULE | ORAL | Status: AC
  Administered 2024-06-14: 300 mg via ORAL

## 2024-06-14 MED ORDER — INDOCYANINE GREEN 25 MG IJ SOLR
INTRAMUSCULAR | Status: AC
  Filled 2024-06-14: qty 10

## 2024-06-14 MED ORDER — PROPOFOL 10 MG/ML IV BOLUS
INTRAVENOUS | Status: AC
  Filled 2024-06-14: qty 20

## 2024-06-14 MED ORDER — ACETAMINOPHEN 500 MG PO TABS
ORAL_TABLET | ORAL | Status: AC
  Filled 2024-06-14: qty 2

## 2024-06-14 MED ORDER — FENTANYL CITRATE (PF) 100 MCG/2ML IJ SOLN
INTRAMUSCULAR | Status: DC | PRN
  Administered 2024-06-14 (×2): 50 ug via INTRAVENOUS

## 2024-06-14 MED ORDER — OXYCODONE HCL 5 MG/5ML PO SOLN: 5.0000 mg | Freq: Once | ORAL | Status: AC | PRN

## 2024-06-14 MED ORDER — ORAL CARE MOUTH RINSE: 15.0000 mL | Freq: Once | OROMUCOSAL | Status: AC

## 2024-06-14 MED ORDER — CHLORHEXIDINE GLUCONATE CLOTH 2 % EX PADS
6.0000 | MEDICATED_PAD | Freq: Once | CUTANEOUS | Status: AC
  Administered 2024-06-14: 6 via TOPICAL

## 2024-06-14 MED ORDER — CELECOXIB 200 MG PO CAPS
ORAL_CAPSULE | ORAL | Status: AC
  Filled 2024-06-14: qty 1

## 2024-06-14 MED ORDER — ROCURONIUM BROMIDE 100 MG/10ML IV SOLN
INTRAVENOUS | Status: DC | PRN
  Administered 2024-06-14: 50 mg via INTRAVENOUS

## 2024-06-14 MED ORDER — SUGAMMADEX SODIUM 200 MG/2ML IV SOLN
INTRAVENOUS | Status: DC | PRN
  Administered 2024-06-14: 200 mg via INTRAVENOUS

## 2024-06-14 MED ORDER — FENTANYL CITRATE (PF) 100 MCG/2ML IJ SOLN
25.0000 ug | INTRAMUSCULAR | Status: DC | PRN
  Administered 2024-06-14: 25 ug via INTRAVENOUS
  Administered 2024-06-14: 50 ug via INTRAVENOUS
  Administered 2024-06-14 (×2): 25 ug via INTRAVENOUS

## 2024-06-14 MED ORDER — DROPERIDOL 2.5 MG/ML IJ SOLN: 0.6250 mg | Freq: Once | INTRAMUSCULAR | Status: DC | PRN

## 2024-06-14 MED ORDER — MIDAZOLAM HCL 2 MG/2ML IJ SOLN
INTRAMUSCULAR | Status: AC
  Filled 2024-06-14: qty 2

## 2024-06-14 MED ORDER — DEXAMETHASONE SOD PHOSPHATE PF 10 MG/ML IJ SOLN
INTRAMUSCULAR | Status: AC
  Filled 2024-06-14: qty 1

## 2024-06-14 MED ORDER — OXYCODONE HCL 5 MG PO TABS
5.0000 mg | ORAL_TABLET | Freq: Once | ORAL | Status: AC | PRN
  Administered 2024-06-14: 5 mg via ORAL

## 2024-06-14 MED ORDER — CHLORHEXIDINE GLUCONATE 0.12 % MT SOLN
OROMUCOSAL | Status: AC
  Filled 2024-06-14: qty 15

## 2024-06-14 MED ORDER — CHLORHEXIDINE GLUCONATE 0.12 % MT SOLN
15.0000 mL | Freq: Once | OROMUCOSAL | Status: AC
  Administered 2024-06-14: 15 mL via OROMUCOSAL

## 2024-06-14 MED ORDER — HYDROCODONE-ACETAMINOPHEN 5-325 MG PO TABS: 1.0000 | ORAL_TABLET | ORAL | 0 refills | Status: AC | PRN

## 2024-06-14 MED ORDER — BUPIVACAINE-EPINEPHRINE (PF) 0.25% -1:200000 IJ SOLN
INTRAMUSCULAR | Status: DC | PRN
  Administered 2024-06-14: 30 mL

## 2024-06-14 MED ORDER — KETOROLAC TROMETHAMINE 30 MG/ML IJ SOLN
INTRAMUSCULAR | Status: AC
  Filled 2024-06-14: qty 1

## 2024-06-14 MED ORDER — DEXAMETHASONE SOD PHOSPHATE PF 10 MG/ML IJ SOLN
INTRAMUSCULAR | Status: DC | PRN
  Administered 2024-06-14: 10 mg via INTRAVENOUS

## 2024-06-14 MED ORDER — PHENYLEPHRINE 80 MCG/ML (10ML) SYRINGE FOR IV PUSH (FOR BLOOD PRESSURE SUPPORT)
PREFILLED_SYRINGE | INTRAVENOUS | Status: DC | PRN
  Administered 2024-06-14 (×2): 80 ug via INTRAVENOUS

## 2024-06-14 MED ORDER — CEFAZOLIN SODIUM-DEXTROSE 2-4 GM/100ML-% IV SOLN
INTRAVENOUS | Status: AC
  Filled 2024-06-14: qty 100

## 2024-06-14 MED ORDER — CHLORHEXIDINE GLUCONATE CLOTH 2 % EX PADS: 6.0000 | MEDICATED_PAD | Freq: Once | CUTANEOUS | Status: DC

## 2024-06-14 MED ORDER — CELECOXIB 200 MG PO CAPS
200.0000 mg | ORAL_CAPSULE | ORAL | Status: AC
  Administered 2024-06-14: 200 mg via ORAL

## 2024-06-14 MED ORDER — LIDOCAINE HCL (CARDIAC) PF 100 MG/5ML IV SOSY
PREFILLED_SYRINGE | INTRAVENOUS | Status: DC | PRN
  Administered 2024-06-14: 100 mg via INTRAVENOUS

## 2024-06-14 MED ORDER — ONDANSETRON HCL 4 MG/2ML IJ SOLN
INTRAMUSCULAR | Status: AC
  Filled 2024-06-14: qty 2

## 2024-06-14 MED ORDER — PROPOFOL 10 MG/ML IV BOLUS
INTRAVENOUS | Status: DC | PRN
  Administered 2024-06-14: 200 mg via INTRAVENOUS

## 2024-06-14 MED ORDER — ACETAMINOPHEN 500 MG PO TABS
1000.0000 mg | ORAL_TABLET | ORAL | Status: AC
  Administered 2024-06-14: 1000 mg via ORAL

## 2024-06-14 NOTE — Anesthesia Postprocedure Evaluation (Signed)
"   Anesthesia Post Note  Patient: Bari DELENA Phenes  Procedure(s) Performed: CHOLECYSTECTOMY, ROBOT-ASSISTED, LAPAROSCOPIC (Abdomen) INDOCYANINE GREEN  FLUORESCENCE IMAGING (ICG)  Patient location during evaluation: PACU Anesthesia Type: General Level of consciousness: awake and alert Pain management: pain level controlled Vital Signs Assessment: post-procedure vital signs reviewed and stable Respiratory status: spontaneous breathing, nonlabored ventilation, respiratory function stable and patient connected to nasal cannula oxygen Cardiovascular status: blood pressure returned to baseline and stable Postop Assessment: no apparent nausea or vomiting Anesthetic complications: no   No notable events documented.   Last Vitals:  Vitals:   06/14/24 1231 06/14/24 1255  BP: 112/61 133/75  Pulse: (!) 58 (!) 51  Resp: 16 20  Temp: (!) 36.1 C (!) 36.1 C  SpO2: 99% 97%    Last Pain:  Vitals:   06/14/24 1255  TempSrc: Temporal  PainSc: 7                  Lendia LITTIE Mae      "

## 2024-06-14 NOTE — Discharge Instructions (Addendum)

## 2024-06-14 NOTE — Op Note (Signed)
Robotic assisted laparoscopic Cholecystectomy  Pre-operative Diagnosis: biliary colic  Post-operative Diagnosis: same  Procedure:  Robotic assisted laparoscopic Cholecystectomy  Surgeon: Caroleen Hamman, MD FACS  Anesthesia: Gen. with endotracheal tube  Findings: Chronic mild Cholecystitis   Estimated Blood Loss: 5cc       Specimens: Gallbladder           Complications: none   Procedure Details  The patient was seen again in the Holding Room. The benefits, complications, treatment options, and expected outcomes were discussed with the patient. The risks of bleeding, infection, recurrence of symptoms, failure to resolve symptoms, bile duct damage, bile duct leak, retained common bile duct stone, bowel injury, any of which could require further surgery and/or ERCP, stent, or papillotomy were reviewed with the patient. The likelihood of improving the patient's symptoms with return to their baseline status is good.  The patient and/or family concurred with the proposed plan, giving informed consent.  The patient was taken to Operating Room, identified  and the procedure verified as Laparoscopic Cholecystectomy.  A Time Out was held and the above information confirmed.  Prior to the induction of general anesthesia, antibiotic prophylaxis was administered. VTE prophylaxis was in place. General endotracheal anesthesia was then administered and tolerated well. After the induction, the abdomen was prepped with Chloraprep and draped in the sterile fashion. The patient was positioned in the supine position.  Cut down technique was used to enter the abdominal cavity and a Hasson trochar was placed after two vicryl stitches were anchored to the fascia. Pneumoperitoneum was then created with CO2 and tolerated well without any adverse changes in the patient's vital signs.  Three 8-mm ports were placed under direct vision. All skin incisions  were infiltrated with a local anesthetic agent before making the  incision and placing the trocars.   The patient was positioned  in reverse Trendelenburg, robot was brought to the surgical field and docked in the standard fashion.  We made sure all the instrumentation was kept indirect view at all times and that there were no collision between the arms. I scrubbed out and went to the console.  The gallbladder was identified, the fundus grasped and retracted cephalad. Adhesions were lysed bluntly. The infundibulum was grasped and retracted laterally, exposing the peritoneum overlying the triangle of Calot. This was then divided and exposed in a blunt fashion. An extended critical view of the cystic duct and cystic artery was obtained.  The cystic duct was clearly identified and bluntly dissected.   Artery and duct were double clipped and divided. Using ICG cholangiography we visualize the cystic duct and CBD w/o evidence of bile injuries. The gallbladder was taken from the gallbladder fossa in a retrograde fashion with the electrocautery.  Hemostasis was achieved with the electrocautery. nspection of the right upper quadrant was performed. No bleeding, bile duct injury or leak, or bowel injury was noted. Robotic instruments and robotic arms were undocked in the standard fashion.  I scrubbed back in.  The gallbladder was removed and placed in an Endocatch bag.   Pneumoperitoneum was released.  The periumbilical port site was closed with interrumpted 0 Vicryl sutures. 4-0 subcuticular Monocryl was used to close the skin. Dermabond was  applied.  The patient was then extubated and brought to the recovery room in stable condition. Sponge, lap, and needle counts were correct at closure and at the conclusion of the case.               Caroleen Hamman, MD, FACS

## 2024-06-14 NOTE — Anesthesia Preprocedure Evaluation (Signed)
 "                                  Anesthesia Evaluation  Patient identified by MRN, date of birth, ID band Patient awake    Reviewed: Allergy & Precautions, NPO status , Patient's Chart, lab work & pertinent test results  History of Anesthesia Complications (+) PONV and history of anesthetic complications  Airway Mallampati: II  TM Distance: >3 FB Neck ROM: full    Dental no notable dental hx.    Pulmonary asthma , sleep apnea , former smoker   Pulmonary exam normal        Cardiovascular negative cardio ROS Normal cardiovascular exam     Neuro/Psych  PSYCHIATRIC DISORDERS Anxiety      Neuromuscular disease    GI/Hepatic negative GI ROS, Neg liver ROS,,,  Endo/Other  Hypothyroidism    Renal/GU      Musculoskeletal   Abdominal   Peds  Hematology negative hematology ROS (+)   Anesthesia Other Findings Past Medical History: No date: Allergy No date: Anxiety No date: Asthma     Comment:  childhood No date: Biliary colic No date: Chronic low blood pressure No date: Complication of anesthesia No date: DDD (degenerative disc disease), cervical No date: Fibroid uterus No date: Former smoker No date: History of gastroesophageal reflux (GERD) No date: History of hiatal hernia No date: Menorrhagia with irregular cycle No date: Migraines No date: Obesity No date: Ovarian cyst No date: Polyp of sigmoid colon No date: PONV (postoperative nausea and vomiting)     Comment:  with Laparoscopy No date: Postoperative hypothyroidism No date: Psoriatic arthritis (HCC) No date: Sleep apnea     Comment:  no CPAP-lost weight and does not have to use No date: Thyroid  disease  Past Surgical History: No date: ABDOMINAL HYSTERECTOMY 06/26/2021: COLONOSCOPY WITH PROPOFOL ; N/A     Comment:  Procedure: COLONOSCOPY WITH BIOPSY;  Surgeon: Jinny Carmine, MD;  Location: [facility];  Service:               Endoscopy;  Laterality:  N/A; 02/22/2024: CYSTOSCOPY; N/A     Comment:  Procedure: CYSTOSCOPY;  Surgeon: Leonce Garnette BIRCH, MD;              Location: [facility];  Service: Gynecology;  Laterality:               N/A; 02/22/2024: HYSTERECTOMY, TOTAL, LAPAROSCOPIC, ROBOT-ASSISTED WITH  SALPINGECTOMY; Bilateral     Comment:  Procedure: HYSTERECTOMY, TOTAL, LAPAROSCOPIC,               ROBOT-ASSISTED WITH SALPINGECTOMY;  Surgeon: Leonce Garnette BIRCH, MD;  Location: [facility];  Service: Gynecology;              Laterality: Bilateral; 1997: LAPAROSCOPY 2018: NASAL SINUS SURGERY 06/26/2021: POLYPECTOMY; N/A     Comment:  Procedure: POLYPECTOMY;  Surgeon: Jinny Carmine, MD;                Location: [facility];  Service: Endoscopy;                Laterality: N/A; 2018: RHINOPLASTY 02/22/2024: ROBOTIC ASSISTED LAPAROSCOPIC OVARIAN CYSTECTOMY; Left     Comment:  Procedure: EXCISION, CYST, OVARY, ROBOT-ASSISTED,  LAPAROSCOPIC;  Surgeon: Leonce Garnette BIRCH, MD;                Location: [facility];  Service: Gynecology;  Laterality:               Left; 2012: THYROIDECTOMY 1996: WISDOM TOOTH EXTRACTION; Bilateral  BMI    Body Mass Index: 32.27 kg/m      Reproductive/Obstetrics negative OB ROS                              Anesthesia Physical Anesthesia Plan  ASA: 2  Anesthesia Plan: General ETT   Post-op Pain Management: Toradol  IV (intra-op)*, Ofirmev  IV (intra-op)* and Dilaudid  IV   Induction: Intravenous  PONV Risk Score and Plan: 4 or greater and Ondansetron , Dexamethasone , Midazolam  and Treatment may vary due to age or medical condition  Airway Management Planned: Oral ETT  Additional Equipment:   Intra-op Plan:   Post-operative Plan: Extubation in OR  Informed Consent: I have reviewed the patients History and Physical, chart, labs and discussed the procedure including the risks, benefits and alternatives for the proposed anesthesia with the  patient or authorized representative who has indicated his/her understanding and acceptance.     Dental Advisory Given  Plan Discussed with: Anesthesiologist, CRNA and Surgeon  Anesthesia Plan Comments: (Patient consented for risks of anesthesia including but not limited to:  - adverse reactions to medications - damage to eyes, teeth, lips or other oral mucosa - nerve damage due to positioning  - sore throat or hoarseness - Damage to heart, brain, nerves, lungs, other parts of body or loss of life  Patient voiced understanding and assent.)        Anesthesia Quick Evaluation  "

## 2024-06-14 NOTE — Interval H&P Note (Signed)
 History and Physical Interval Note:  06/14/2024 10:00 AM  Alison Logan  has presented today for surgery, with the diagnosis of Biliary colic.  The various methods of treatment have been discussed with the patient and family. After consideration of risks, benefits and other options for treatment, the patient has consented to  Procedures with comments: CHOLECYSTECTOMY, ROBOT-ASSISTED, LAPAROSCOPIC (N/A) - w/ICG as a surgical intervention.  The patient's history has been reviewed, patient examined, no change in status, stable for surgery.  I have reviewed the patient's chart and labs.  Questions were answered to the patient's satisfaction.     Coleman Kalas F Kirstine Jacquin

## 2024-06-14 NOTE — Anesthesia Procedure Notes (Signed)
 Procedure Name: Intubation Date/Time: 06/14/2024 11:20 AM  Performed by: Dorcus Juliene SAILOR, CRNAPre-anesthesia Checklist: Patient identified, Patient being monitored, Timeout performed, Emergency Drugs available and Suction available Patient Re-evaluated:Patient Re-evaluated prior to induction Oxygen Delivery Method: Circle system utilized Preoxygenation: Pre-oxygenation with 100% oxygen Induction Type: IV induction Ventilation: Mask ventilation without difficulty Laryngoscope Size: Mac and 3 Grade View: Grade I Tube type: Oral Tube size: 7.0 mm Number of attempts: 1 Airway Equipment and Method: Stylet Placement Confirmation: ETT inserted through vocal cords under direct vision, positive ETCO2 and breath sounds checked- equal and bilateral Secured at: 21 cm Tube secured with: Tape Dental Injury: Teeth and Oropharynx as per pre-operative assessment

## 2024-06-14 NOTE — Transfer of Care (Signed)
 Immediate Anesthesia Transfer of Care Note  Patient: Alison Logan  Procedure(s) Performed: CHOLECYSTECTOMY, ROBOT-ASSISTED, LAPAROSCOPIC (Abdomen) INDOCYANINE GREEN  FLUORESCENCE IMAGING (ICG)  Patient Location: PACU  Anesthesia Type:General  Level of Consciousness: sedated  Airway & Oxygen Therapy: Patient Spontanous Breathing and Patient connected to face mask oxygen  Post-op Assessment: Report given to RN and Post -op Vital signs reviewed and stable  Post vital signs: Reviewed  Last Vitals:  Vitals Value Taken Time  BP 103/50 06/14/24 11:39  Temp    Pulse 56 06/14/24 11:42  Resp 12 06/14/24 11:42  SpO2 99 % 06/14/24 11:42  Vitals shown include unfiled device data.  Last Pain:  Vitals:   06/14/24 1010  TempSrc: Temporal  PainSc:          Complications: No notable events documented.

## 2024-06-15 ENCOUNTER — Encounter: Payer: Self-pay | Admitting: Surgery

## 2024-07-04 ENCOUNTER — Encounter: Admitting: Surgery
